# Patient Record
Sex: Female | Born: 1970 | Race: Black or African American | Hispanic: No | Marital: Married | Smoking: Never smoker
Health system: Southern US, Community
[De-identification: ages and names within clinical notes are randomized; demographics above are authoritative.]

## PROBLEM LIST (undated history)

## (undated) DIAGNOSIS — J302 Other seasonal allergic rhinitis: Secondary | ICD-10-CM

## (undated) DIAGNOSIS — E039 Hypothyroidism, unspecified: Secondary | ICD-10-CM

## (undated) DIAGNOSIS — L309 Dermatitis, unspecified: Secondary | ICD-10-CM

## (undated) DIAGNOSIS — F419 Anxiety disorder, unspecified: Secondary | ICD-10-CM

## (undated) DIAGNOSIS — I10 Essential (primary) hypertension: Secondary | ICD-10-CM

## (undated) DIAGNOSIS — K219 Gastro-esophageal reflux disease without esophagitis: Secondary | ICD-10-CM

## (undated) DIAGNOSIS — T783XXA Angioneurotic edema, initial encounter: Secondary | ICD-10-CM

## (undated) HISTORY — PX: MYOMECTOMY: SHX85

## (undated) HISTORY — DX: Angioneurotic edema, initial encounter: T78.3XXA

## (undated) HISTORY — DX: Dermatitis, unspecified: L30.9

---

## 1999-08-31 ENCOUNTER — Inpatient Hospital Stay (HOSPITAL_COMMUNITY): Admission: RE | Admit: 1999-08-31 | Discharge: 1999-09-01 | Payer: Self-pay | Admitting: Obstetrics and Gynecology

## 1999-08-31 ENCOUNTER — Encounter (INDEPENDENT_AMBULATORY_CARE_PROVIDER_SITE_OTHER): Payer: Self-pay

## 2000-03-24 ENCOUNTER — Ambulatory Visit (HOSPITAL_COMMUNITY): Admission: RE | Admit: 2000-03-24 | Discharge: 2000-03-24 | Payer: Self-pay | Admitting: Obstetrics and Gynecology

## 2000-03-24 ENCOUNTER — Encounter: Payer: Self-pay | Admitting: Obstetrics and Gynecology

## 2002-09-07 ENCOUNTER — Other Ambulatory Visit: Admission: RE | Admit: 2002-09-07 | Discharge: 2002-09-07 | Payer: Self-pay | Admitting: Obstetrics and Gynecology

## 2003-03-06 ENCOUNTER — Encounter (INDEPENDENT_AMBULATORY_CARE_PROVIDER_SITE_OTHER): Payer: Self-pay | Admitting: Specialist

## 2003-03-06 ENCOUNTER — Inpatient Hospital Stay (HOSPITAL_COMMUNITY): Admission: AD | Admit: 2003-03-06 | Discharge: 2003-03-09 | Payer: Self-pay | Admitting: Obstetrics and Gynecology

## 2003-03-10 ENCOUNTER — Inpatient Hospital Stay (HOSPITAL_COMMUNITY): Admission: AD | Admit: 2003-03-10 | Discharge: 2003-03-10 | Payer: Self-pay | Admitting: Obstetrics and Gynecology

## 2003-03-10 ENCOUNTER — Encounter: Admission: RE | Admit: 2003-03-10 | Discharge: 2003-04-09 | Payer: Self-pay | Admitting: Obstetrics and Gynecology

## 2003-04-02 ENCOUNTER — Other Ambulatory Visit: Admission: RE | Admit: 2003-04-02 | Discharge: 2003-04-02 | Payer: Self-pay | Admitting: Obstetrics and Gynecology

## 2004-05-05 ENCOUNTER — Other Ambulatory Visit: Admission: RE | Admit: 2004-05-05 | Discharge: 2004-05-05 | Payer: Self-pay | Admitting: Obstetrics and Gynecology

## 2005-04-13 ENCOUNTER — Other Ambulatory Visit: Admission: RE | Admit: 2005-04-13 | Discharge: 2005-04-13 | Payer: Self-pay | Admitting: Obstetrics and Gynecology

## 2005-09-19 ENCOUNTER — Inpatient Hospital Stay (HOSPITAL_COMMUNITY): Admission: AD | Admit: 2005-09-19 | Discharge: 2005-09-19 | Payer: Self-pay | Admitting: Obstetrics and Gynecology

## 2005-10-29 ENCOUNTER — Inpatient Hospital Stay (HOSPITAL_COMMUNITY): Admission: RE | Admit: 2005-10-29 | Discharge: 2005-11-01 | Payer: Self-pay | Admitting: Obstetrics and Gynecology

## 2006-07-14 ENCOUNTER — Emergency Department (HOSPITAL_COMMUNITY): Admission: EM | Admit: 2006-07-14 | Discharge: 2006-07-14 | Payer: Self-pay | Admitting: Emergency Medicine

## 2006-07-24 ENCOUNTER — Encounter: Admission: RE | Admit: 2006-07-24 | Discharge: 2006-07-24 | Payer: Self-pay | Admitting: Family Medicine

## 2008-10-01 ENCOUNTER — Encounter: Admission: RE | Admit: 2008-10-01 | Discharge: 2008-10-01 | Payer: Self-pay | Admitting: Family Medicine

## 2009-09-11 ENCOUNTER — Encounter: Admission: RE | Admit: 2009-09-11 | Discharge: 2009-09-11 | Payer: Self-pay | Admitting: Otolaryngology

## 2010-07-24 NOTE — Discharge Summary (Signed)
Tracy Wade, Tracy Wade            ACCOUNT NO.:  [id number]   MEDICAL RECORD NO.:  [id number]          PATIENT TYPE:  INP   LOCATION:  9107                          FACILITY:  [facility]   PHYSICIAN:  Michelle L. Grewal, M.D.DATE OF BIRTH:  08-Mar-1971   DATE OF ADMISSION:  10/29/2005  DATE OF DISCHARGE:  11/01/2005                                 DISCHARGE SUMMARY   ADMITTING DIAGNOSIS:  1. Intrauterine pregnancy at term.  2. Previous cesarean section, desires repeat.   DISCHARGE DIAGNOSIS:  Status post low transverse cesarean section to a  viable female infant.   PROCEDURE:  Repeat low transverse cesarean section.   REASON FOR ADMISSION:  Please see dictated H&P.   HOSPITAL COURSE:  The patient is 40 year old gravida 2, para 1 that  presented to [facility] for a scheduled cesarean section.  The patient had had a previous cesarean delivery and previous myomectomy.  In view of the previous myomectomy, a trial of labor was contraindicated and  the patient was now scheduled for a repeat cesarean delivery.  On the morning of admission, the patient was taken to the operating room  where spinal anesthesia was administered without difficulty.  A low  transverse incision was made with the delivery of a viable female infant,  weighing 5 pounds 13 ounces, Apgar's of 9 at 1 minute and 9 at 5 minutes.  The patient tolerated the procedure well and was taken to the recovery room  in stable condition.  On postoperative day #1, the patient was without complaint.  Vital signs  were stable.  She was afebrile.  Abdomen was soft with good return of bowel  function.  Abdominal dressing was noted to be clean, dry and intact.  Laboratory findings revealed hemoglobin of 11.1, platelet count 174,000, WBC  count of 9.2.  On postoperative day #2, the patient was without complaint.  Vital signs  remained stable.  She was afebrile.  Abdominal dressing had been removed  revealing incision that was  clean, dry and intact.  She was ambulating well,  tolerating a regular diet, without complaints of nausea, vomiting.  On postoperative day #3, the patient was without complaint.  Vital signs  remained stable.  She was afebrile.  Abdomen was soft.  Fundus was firm and  nontender.  Incision was clean, dry and intact.  Staples and the patient was  discharged home.   CONDITION ON DISCHARGE:  Good.   DIET:  Regular as tolerated.   ACTIVITY:  No heavy lifting, no driving x2 weeks, no vaginal entry.   FOLLOWUP:  1. She is to follow up in the office in 2 weeks for an incision check.  2. She is to call for temperature greater than 100 degrees, persistent      nausea, vomiting, heavy vaginal bleeding, and/or redness drainage from      incisional site.   DISCHARGE MEDICATIONS:  1. Percocet 5/325, #30, one p.o. every four to six hours p.r.n.  2. Motrin 600 mg every 6 hours.  3. Prenatal vitamins one p.o. daily.  4. Colace one p.o. daily p.r.n.  Tracy Wade, N.P.      Tracy Wade. Tracy Wade, M.D.  Electronically Signed    CC/MEDQ  D:  11/23/2005  T:  11/23/2005  Job:  [id number]

## 2010-07-24 NOTE — Discharge Summary (Signed)
NAME:  Tracy Wade, Tracy Wade                      ACCOUNT NO.:  [id number]   MEDICAL RECORD NO.:  [id number]                   PATIENT TYPE:  INP   LOCATION:  9125                                 FACILITY:  [facility]   PHYSICIAN:  Tracie Harrier, M.D.              DATE OF BIRTH:  05-17-1970   DATE OF ADMISSION:  03/06/2003  DATE OF DISCHARGE:  03/09/2003                                 DISCHARGE SUMMARY   ADMITTING DIAGNOSES:  1. Intrauterine pregnancy at 60 and four-sevenths weeks estimated     gestational age.  2. Vaginal bleeding.  3. Low-lying placenta.  4. Previous myomectomy.   DISCHARGE DIAGNOSES:  1. Status post low transverse cesarean section secondary to vaginal bleeding     with small placental abruption.  2. Viable female infant.   PROCEDURE:  Primary low transverse cesarean section.   REASON FOR ADMISSION:  Please see written H&P.   HOSPITAL COURSE:  The patient was a 40 year old female primigravida that was  admitted to [facility] at 35 and five-sevenths weeks  estimated gestational age with bright red vaginal bleeding.  The patient had  had a previous myomectomy and also had known low-lying placenta.  The  patient was given subcu terbutaline and p.o. hydration for tocolysis.  However, bright red bleeding continued.  Fetal heart tones were reactive.  Based on previous myomectomy and continued vaginal bleeding a decision was  made to proceed with a cesarean delivery.  The patient was then transferred  to the operating room where spinal anesthesia was administered without  difficulty.  A low transverse incision was made with the delivery of a  viable female infant weighing 4 pounds 6 ounces with Apgars of 9 at one minute  and 9 at five minutes.  Umbilical cord pH was 7.30.  The patient tolerated  the procedure well and was taken to the recovery room in stable condition.  On postoperative day #1 vital signs were stable; she was afebrile.  Abdomen  was soft  with good return of bowel function.  Fundus was firm and nontender.  Abdominal dressing was noted to be clean, dry, and intact.  Labs revealed  hemoglobin of 9.5; platelet count of 116,000.  On postoperative day #2 vital  signs were stable; she was afebrile.  Abdomen was soft and nontender.  Abdominal dressing had been removed revealing an incision that was clean,  dry, and intact.  On postoperative day #3 the patient was doing well.  She  was ambulating without assistance, tolerating a regular diet without  complaints of nausea and vomiting.  Fundus was firm and nontender.  Incision  was clean, dry, and intact.  Staples were removed and the patient was  discharged home.   CONDITION ON DISCHARGE:  Good.   DIET:  Regular as tolerated.   ACTIVITY:  No heavy lifting, no driving x2 weeks, no vaginal entry.   FOLLOW-UP:  The patient is to follow  up in the office in 1 week for an  incision check.  She is to call for temperature greater than 100 degrees,  persistent nausea and vomiting, heavy vaginal bleeding, and/or redness or  drainage from the incisional site.   DISCHARGE MEDICATIONS:  1. Tylox #30 one p.o. q.4-6h. p.r.n.  2. Motrin 600 mg q.6h.  3. Prenatal vitamins one p.o. daily.  4. Colace one p.o. daily p.r.n.     Julio Sicks, N.P.                        Tracie Harrier, M.D.    CC/MEDQ  D:  04/12/2003  T:  04/12/2003  Job:  [id number]

## 2010-07-24 NOTE — Op Note (Signed)
NAME:  Tracy Wade, Tracy Wade                      ACCOUNT NO.:  [id number]   MEDICAL RECORD NO.:  [id number]                   PATIENT TYPE:  INP   LOCATION:  NA                                   [facility]   PHYSICIAN:  Tracie Harrier, M.D.              DATE OF BIRTH:  12/04/1970   DATE OF PROCEDURE:  03/07/2003  DATE OF DISCHARGE:                                 OPERATIVE REPORT   PREOPERATIVE DIAGNOSIS:  1. Intrauterine pregnancy at 35-4/7 weeks.  2. Vaginal bleeding.  3. Low-lying placenta.  4. Previous myomectomy.   POSTOPERATIVE DIAGNOSIS:  1. Intrauterine pregnancy at 35-4/7 weeks.  2. Vaginal bleeding.  3. Low-lying placenta.  4. Previous myomectomy.  5. Small abruption noted.   OPERATION PERFORMED:  Primary low transverse cesarean section.   SURGEON:  Tracie Harrier, M.D.   ANESTHESIA:  Spinal.   ESTIMATED BLOOD LOSS:  800 mL.   COMPLICATIONS:  None.   FINDINGS:  At 6 through a low transverse uterine incision, a viable female  infant was delivered from the vertex presentation.  The baby was a female  weighing four pounds eight ounces.  Apgars were 9 and 9.  pH 7.30.  At time  of surgery, a small placental abruption was noted.  The adnexa was  visualized and noted to be normal.  The uterus was somewhat globularly  enlarged.   DESCRIPTION OF PROCEDURE:  The patient was taken to the operating room where  a spinal anesthetic was administered.  The patient was placed on the  operating table in the left lateral tilt position.  The abdomen was prepped  and draped in the usual sterile fashion with Betadine and sterile drapes.  A  Foley catheter was inserted.  The abdomen was then entered through a  Pfannenstiel incision and carried down sharply in the usual fashion.  The  peritoneum was atraumatically entered.  The vesicouterine peritoneum  overlying the lower uterine segment was incised and a bladder flap was  bluntly and sharply created over the lower uterine  segment.  A bladder blade  was then placed behind the bladder to ensure it protection during the C-  section.  Next, the uterus was entered through a low transverse incision and  carried out laterally using the operator's fingers.  The vertex was elevated  into the incision and delivered promptly and easily with the assistance of a  Mighty-Vac vacuum extraction device and delivered easily at 0804.  The  oronasopharynx was thoroughly bulb suctioned and the cord doubly clamped and  cut.  The baby handed promptly to the pediatricians.  The baby was a female  weighing four pounds eight ounces.  The baby did well.  Apgars were 9 and 9.  Cord pH 7.30.  The placenta was then extracted intact with a three-vessel  cord without difficulty.  There was a small abruption noted and the placenta  was sent to pathology.  The anterior of the uterus was wiped clean with a  wet sponge.  The uterine incision was then closed in a two layer fashion.  The first layer a running interlocking suture of #1 Vicryl.  A second  imbricating suture was placed across the primary suture line with a running  suture of #1 Vicryl as well.  Good hemostasis was noted.  The pelvis was  thoroughly irrigated.  Good hemostasis was noted again and attention was  turned to closure.  The rectus muscle and anterior peritoneum was closed  with a running suture of #1 Vicryl.  The subfascial layers were hemostatic.  The fascia was closed with a running suture of #1 Vicryl.  The subcutaneous  tissue was irrigated and made hemostatic using the Bovie cautery.  The skin  was reapproximated with staples and a sterile dressing applied.  Final  sponge, needle and instrument counts were correct times three.  There were  no perioperative complications.  The baby initially did well.  The patient  did receive antibiotic intraoperatively.                                               Tracie Harrier, M.D.    REG/MEDQ  D:  03/07/2003  T:   03/07/2003  Job:  [id number]

## 2010-07-24 NOTE — Discharge Summary (Signed)
[facility]  Patient:    Tracy Wade, Tracy Wade                   MRN: [id number] Adm. Date:  [id number] Disc. Date: [id number] Attending:  Lendon Colonel                           Discharge Summary  ADMISSION DIAGNOSIS:  Uterine leiomyoma.  DISCHARGE DIAGNOSIS:  Uterine leiomyoma.  OPERATION:  Myomectomy.  BRIEF HISTORY:  This is a 40 -year-old female who was advised to have a myomectomy for a large uterine fibroid.  Detailed informed consent was given to the patient prior to surgery.  Admission hemoglobin was 13, coagulation profile and metabolic profile were normal.  HOSPITAL COURSE:  The patient underwent an uneventful myomectomy with removal of a 142-g uterine fibroid.  Her postoperative course was uncomplicated.  She remained afebrile and without complaints.  She had one focal area of endometriosis in a tube which was treated with cauterization.  She was discharged on the morning after surgery to home and office care.  Asked to call for fever or bleeding or any other difficulties she would encounter.  CONDITION ON DISCHARGE:  Improved. DD:  09/08/99 TD:  09/08/99 Job: [id number]

## 2010-07-24 NOTE — Op Note (Signed)
Tracy Wade, Tracy Wade            ACCOUNT NO.:  [id number]   MEDICAL RECORD NO.:  [id number]          PATIENT TYPE:  INP   LOCATION:  9107                          FACILITY[facility]   PHYSICIAN:  Juluis Mire, M.D.   DATE OF BIRTH:  12/25/1970   DATE OF PROCEDURE:  10/29/2005  DATE OF DISCHARGE:                                 OPERATIVE REPORT   PREOPERATIVE DIAGNOSES:  1. Intrauterine pregnancy at term.  2. Prior cesarean section, desires a repeat.   POSTOPERATIVE DIAGNOSES:  1. Intrauterine pregnancy at term.  2. Prior cesarean section, desires a repeat.   OPERATION:  Repeat low transverse cesarean section./   ANESTHESIA:  Spinal anesthesia.   SURGEON:  Juluis Mire, M.D.   ESTIMATED BLOOD LOSS:  Was 400 to 500 mL.   INTRAOPERATIVE BLOOD REPLACED:  None.   COMPLICATIONS:  None.   INDICATIONS FOR PROCEDURE:  As dictated in the history and physical.   DESCRIPTION OF PROCEDURE:  The patient was taken to the operating room and  placed in the supine position with a left lateral tilt.  After a  satisfactory level of spinal anesthesia was obtained, the abdomen was  prepped out with Betadine and draped as a sterile field.  The prior low  transverse skin incision was identified and excised.  The incision was then  extended through the subcutaneous tissue.  The fascia was then entered  sharply and the incision in the fascia was extended laterally.  The fascia  was taken off the muscle superiorly and inferiorly.  The rectus muscles were  separated in the midline.  The peritoneum was entered sharply and the  incision in the peritoneum was extended both superiorly and inferiorly.  A  low transverse bladder flap was developed.  A low transverse incision was  begun with a knife.  Amniotic fluid was clear.  The infant was delivered  with the aid of a vacuum extractor and fundal pressure.  It was a viable  female weighing 5 pounds and 14 ounces.  Apgars were 9/9.  The pH is pending.  The placenta was delivered manually and sent for pathological review.  The  uterus was closed with interlocking sutures of #0 chromic using a two-layer  closure technique.  We had good hemostasis and clear urine output.  The  tubes and ovaries were unremarkable.  The muscle was reapproximated with  running suture of #3-0 Vicryl.  The fascia was closed with running suture of  #0 PDS.  The skin was closed with staples and Steri-Strips.  The sponge,  instrument  and needle counts were reported as correct by the circulating nurse x2.  The  Foley catheter remained clear at the time of closure.   The patient tolerated the procedure well and was returned to the recovery  room in good condition.      Juluis Mire, M.D.  Electronically Signed     JSM/MEDQ  D:  10/29/2005  T:  10/29/2005  Job:  [id number]

## 2010-07-24 NOTE — Op Note (Signed)
[facility]  Patient:    Tracy Wade, Tracy Wade                   MRN: [id number] Proc. Date: 08/31/99 Adm. Date:  [id number] Attending:  Lendon Colonel                           Operative Report  PREOPERATIVE DIAGNOSIS:  Uterine myoma, uterine fibroids.  POSTOPERATIVE DIAGNOSIS:  Uterine myoma, uterine fibroids.  POSTOPERATIVE DIAGNOSIS:  Myomectomy, fulguration of focal endometriosis of left fallopian tube and small seedling myoma anteriorly.  DESCRIPTION OF PROCEDURE:  The patient was placed in lithotomy position, prepped and draped in the usual fashion. The abdomen was entered through a transverse approach. Hemostasis with the Bovie. The peritoneum was opened vertically. The fibroid was a fundal fibroid off the posterior surface. It was grasped, infiltrated with Pitressin solution and the fibroid was excised. Hemostasis was accomplished with 3-0 Vicryl and the myoma bed was closed with interrupted sutures of 3-0 Vicryl. Hemostasis was accomplished with the Bovied. Meticulous hemostasis was maintained. There was a small bluish area just under the left fallopian tube that was cauterized with the Bovie and a small seedling myoma that was less than 0.1 cm. The tubes and ovaries all looked perfectly normal. The peritoneum was copiously washed, parietoperitoneum was closed with 2-0 PDS, the fascia was closed with 2-0 PDS continuous and interrupted. The skin was closed with clips after hemostasis was secure. The incision was then infiltrated with 0.5% Marcaine with epinephrine. Ms. Lynden Ang tolerated this procedure well and was sent to the recovery room in good condition. DD:  08/31/99 TD:  08/31/99 Job: [id number]

## 2010-07-24 NOTE — H&P (Signed)
NAME:  Tracy Wade, Tracy Wade NO.:  [id number]   MEDICAL RECORD NO.:  [id number]          PATIENT TYPE:  INP   LOCATION:                                FACILITY[facility]  [facility]   PHYSICIAN:  Juluis Mire, M.D.        DATE OF BIRTH:   DATE OF ADMISSION:  10/29/2005  DATE OF DISCHARGE:                                HISTORY & PHYSICAL   HISTORY OF PRESENT ILLNESS:  The patient is a 40 year old gravida 2, para 1,  abortus 0 female who presents for repeat cesarean section.  Estimated date  of confinement is September 4, this gives her an estimated gestational age  of [redacted] weeks, consistent with initial exam and ultrasound.   The patient had a prior myomectomy.  Last pregnancy we did a primary  cesarean section because of this.  In view of the myomectomy, trial of labor  was contraindicated, and we will proceed with another cesarean section.   The patient is also at risk for advanced maternal age.  She declined  amniocentesis.  She did undergo first trimester screening at the [facility] that was negative.  Her prenatal course was otherwise  uncomplicated.   ALLERGIES:  SULFA AND ERYTHROMYCIN.   MEDICATIONS:  Prenatal vitamins.   PAST MEDICAL/FAMILY/SOCIAL HISTORY:  Please see prenatal records.   REVIEW OF SYSTEMS:  Noncontributory.   PHYSICAL EXAMINATION:  VITAL SIGNS:  The patient is afebrile with stable  vital signs.  HEENT:  The patient is normocephalic.  Pupils equal, round and reactive to  light and accommodations.  Extraocular movements intact.  Sclerae and  conjunctivae were clear.  Oropharynx clear.  NECK:  Without thyromegaly.  BREASTS:  No discrete masses.  LUNGS:  Clear.  CARDIOVASCULAR:  Regular rate, grade 2/6 systolic ejection murmur.  No  clicks or gallops.  ABDOMEN:  Gravid uterus, consistent with dates.  Well healed low transverse  incision.  PELVIC:  Deferred.  EXTREMITIES:  Trace edema.  NEUROLOGICAL:  Grossly within normal limits.  Deep  tendon reflexes were 2+  with no clonus.   IMPRESSION:  Intrauterine pregnancy at term with prior cesarean section.  Desires to repeat.   PLAN:  The patient will undergo repeat cesarean section.  Risks have been  discussed including the risk of infection.  Risks of hemorrhage that could  require transfusion, risk of AIDS or hepatitis, risk or injury to adjacent  organs including bladder/bowel/ureter that could require further exploratory  surgery, risk of deep vein thrombosis and pulmonary embolus.  The patient  expressed understanding of indications and risks.      Juluis Mire, M.D.  Electronically Signed     JSM/MEDQ  D:  10/29/2005  T:  10/29/2005  Job:  [id number]

## 2011-02-02 ENCOUNTER — Other Ambulatory Visit: Payer: Self-pay | Admitting: Family Medicine

## 2011-02-02 DIAGNOSIS — R209 Unspecified disturbances of skin sensation: Secondary | ICD-10-CM

## 2011-02-04 ENCOUNTER — Ambulatory Visit
Admission: RE | Admit: 2011-02-04 | Discharge: 2011-02-04 | Disposition: A | Discharge status: Home / Self Care | Payer: BC Managed Care – PPO | Source: Ambulatory Visit | Attending: Family Medicine | Admitting: Family Medicine

## 2011-02-04 DIAGNOSIS — R209 Unspecified disturbances of skin sensation: Secondary | ICD-10-CM

## 2011-02-04 MED ORDER — GADOBENATE DIMEGLUMINE 529 MG/ML IV SOLN
13.0000 mL | Freq: Once | INTRAVENOUS | Status: AC | PRN
  Administered 2011-02-04: 13 mL via INTRAVENOUS

## 2011-05-30 ENCOUNTER — Encounter (HOSPITAL_COMMUNITY): Payer: Self-pay

## 2011-05-30 ENCOUNTER — Other Ambulatory Visit: Payer: Self-pay

## 2011-05-30 ENCOUNTER — Emergency Department (HOSPITAL_COMMUNITY): Payer: BC Managed Care – PPO

## 2011-05-30 ENCOUNTER — Observation Stay (HOSPITAL_COMMUNITY)
Admission: EM | Admit: 2011-05-30 | Discharge: 2011-05-31 | Disposition: A | Discharge status: Home / Self Care | Payer: BC Managed Care – PPO | Attending: Family Medicine | Admitting: Family Medicine

## 2011-05-30 DIAGNOSIS — IMO0001 Reserved for inherently not codable concepts without codable children: Secondary | ICD-10-CM | POA: Insufficient documentation

## 2011-05-30 DIAGNOSIS — F419 Anxiety disorder, unspecified: Secondary | ICD-10-CM

## 2011-05-30 DIAGNOSIS — R079 Chest pain, unspecified: Secondary | ICD-10-CM | POA: Insufficient documentation

## 2011-05-30 DIAGNOSIS — I10 Essential (primary) hypertension: Secondary | ICD-10-CM | POA: Insufficient documentation

## 2011-05-30 DIAGNOSIS — Z566 Other physical and mental strain related to work: Secondary | ICD-10-CM

## 2011-05-30 DIAGNOSIS — R9431 Abnormal electrocardiogram [ECG] [EKG]: Secondary | ICD-10-CM | POA: Insufficient documentation

## 2011-05-30 DIAGNOSIS — E876 Hypokalemia: Secondary | ICD-10-CM | POA: Insufficient documentation

## 2011-05-30 DIAGNOSIS — R002 Palpitations: Principal | ICD-10-CM | POA: Insufficient documentation

## 2011-05-30 DIAGNOSIS — R072 Precordial pain: Secondary | ICD-10-CM

## 2011-05-30 DIAGNOSIS — F459 Somatoform disorder, unspecified: Secondary | ICD-10-CM | POA: Insufficient documentation

## 2011-05-30 DIAGNOSIS — F411 Generalized anxiety disorder: Secondary | ICD-10-CM | POA: Insufficient documentation

## 2011-05-30 LAB — POCT I-STAT, CHEM 8
Calcium, Ion: 1.2 mmol/L (ref 1.12–1.32)
Glucose, Bld: 99 mg/dL (ref 70–99)
HCT: 47 % — ABNORMAL HIGH (ref 36.0–46.0)
Hemoglobin: 16 g/dL — ABNORMAL HIGH (ref 12.0–15.0)
TCO2: 23 mmol/L (ref 0–100)

## 2011-05-30 LAB — D-DIMER, QUANTITATIVE: D-Dimer, Quant: 0.59 ug/mL-FEU — ABNORMAL HIGH (ref 0.00–0.48)

## 2011-05-30 LAB — DIFFERENTIAL
Basophils Absolute: 0 10*3/uL (ref 0.0–0.1)
Basophils Relative: 0 % (ref 0–1)
Neutro Abs: 4.7 10*3/uL (ref 1.7–7.7)
Neutrophils Relative %: 65 % (ref 43–77)

## 2011-05-30 LAB — CBC
MCHC: 35.9 g/dL (ref 30.0–36.0)
RDW: 12.2 % (ref 11.5–15.5)
WBC: 7.2 10*3/uL (ref 4.0–10.5)

## 2011-05-30 LAB — CARDIAC PANEL(CRET KIN+CKTOT+MB+TROPI)
Relative Index: 1.5 (ref 0.0–2.5)
Relative Index: 1.4 (ref 0.0–2.5)
Troponin I: <0.3 ng/mL (ref <0.30)

## 2011-05-30 LAB — MAGNESIUM: Magnesium: 1.6 mg/dL (ref 1.5–2.5)

## 2011-05-30 LAB — POCT I-STAT TROPONIN I: Troponin i, poc: 0 ng/mL (ref 0.00–0.08)

## 2011-05-30 MED ORDER — LORAZEPAM 0.5 MG PO TABS
0.5000 mg | ORAL_TABLET | Freq: Every day | ORAL | Status: DC
  Administered 2011-05-30: 0.5 mg via ORAL
  Filled 2011-05-30: qty 1

## 2011-05-30 MED ORDER — FLUTICASONE PROPIONATE 50 MCG/ACT NA SUSP
2.0000 | Freq: Every day | NASAL | Status: DC
  Administered 2011-05-31: 2 via NASAL
  Filled 2011-05-30: qty 16

## 2011-05-30 MED ORDER — ALUM & MAG HYDROXIDE-SIMETH 200-200-20 MG/5ML PO SUSP: 30.0000 mL | Freq: Four times a day (QID) | ORAL | Status: DC | PRN

## 2011-05-30 MED ORDER — ASPIRIN 81 MG PO CHEW
324.0000 mg | CHEWABLE_TABLET | Freq: Once | ORAL | Status: AC
  Administered 2011-05-30: 324 mg via ORAL
  Filled 2011-05-30: qty 4

## 2011-05-30 MED ORDER — CALCIUM CARBONATE ANTACID 500 MG PO CHEW
1.0000 | CHEWABLE_TABLET | Freq: Three times a day (TID) | ORAL | Status: DC
  Administered 2011-05-30 – 2011-05-31 (×3): 200 mg via ORAL
  Filled 2011-05-30 (×7): qty 1

## 2011-05-30 MED ORDER — SODIUM CHLORIDE 0.9 % IV BOLUS (SEPSIS)
500.0000 mL | Freq: Once | INTRAVENOUS | Status: AC
  Administered 2011-05-30: 500 mL via INTRAVENOUS

## 2011-05-30 MED ORDER — ADULT MULTIVITAMIN W/MINERALS CH
1.0000 | ORAL_TABLET | Freq: Every day | ORAL | Status: DC
  Administered 2011-05-30 – 2011-05-31 (×2): 1 via ORAL
  Filled 2011-05-30 (×2): qty 1

## 2011-05-30 MED ORDER — IOHEXOL 350 MG/ML SOLN
80.0000 mL | Freq: Once | INTRAVENOUS | Status: AC | PRN
  Administered 2011-05-30: 80 mL via INTRAVENOUS

## 2011-05-30 MED ORDER — LORAZEPAM 1 MG PO TABS
1.0000 mg | ORAL_TABLET | ORAL | Status: AC
  Administered 2011-05-30: 1 mg via ORAL
  Filled 2011-05-30: qty 1

## 2011-05-30 MED ORDER — ASPIRIN EC 81 MG PO TBEC
81.0000 mg | DELAYED_RELEASE_TABLET | Freq: Every day | ORAL | Status: DC
  Administered 2011-05-31: 81 mg via ORAL
  Filled 2011-05-30 (×2): qty 1

## 2011-05-30 MED ORDER — METOPROLOL TARTRATE 1 MG/ML IV SOLN
2.5000 mg | Freq: Once | INTRAVENOUS | Status: AC
  Administered 2011-05-30: 2.5 mg via INTRAVENOUS
  Filled 2011-05-30: qty 5

## 2011-05-30 MED ORDER — POTASSIUM CHLORIDE CRYS ER 20 MEQ PO TBCR
40.0000 meq | EXTENDED_RELEASE_TABLET | Freq: Once | ORAL | Status: AC
  Administered 2011-05-30: 40 meq via ORAL
  Filled 2011-05-30: qty 2

## 2011-05-30 MED ORDER — HYDROCODONE-ACETAMINOPHEN 5-325 MG PO TABS: 1.0000 | ORAL_TABLET | ORAL | Status: DC | PRN

## 2011-05-30 MED ORDER — ASPIRIN 81 MG PO TABS: 324.0000 mg | ORAL_TABLET | Freq: Once | ORAL | Status: DC

## 2011-05-30 NOTE — Progress Notes (Signed)
CSW assessment completed. Dori Shey Yott MSW,LCSW w/e Coverage [phone]  

## 2011-05-30 NOTE — ED Notes (Signed)
Attempted to call report. RN from 2000 will call back

## 2011-05-30 NOTE — ED Provider Notes (Signed)
History     CSN: [id number]  Arrival date & time 05/30/11  4098   First MD Initiated Contact with Patient 05/30/11 [phone]      Chief Complaint  Patient presents with  . Palpitations    (Consider location/radiation/quality/duration/timing/severity/associated sxs/prior treatment) Patient is a 41 y.o. female presenting with palpitations and chest pain. The history is provided by the patient. No language interpreter was used.  Palpitations  This is a new problem. The current episode started more than 1 week ago. Episode frequency: occasionally. The problem has been gradually worsening. Associated with: nothing, awoke her from sleep tonight. Associated symptoms include chest pain. Pertinent negatives include no diaphoresis and no shortness of breath. Risk factors include family history. Her past medical history does not include heart disease or hyperthyroidism.  Chest Pain The chest pain began less than 1 hour ago. Duration of episode(s) is 15 minutes. Chest pain occurs intermittently. The chest pain is unchanged. Associated with: none. At its most intense, the pain is at 5/10. The pain is currently at 1/10. The severity of the pain is moderate. The quality of the pain is described as pressure-like. The pain does not radiate (left mid chest). Exacerbated by: nothing. Primary symptoms include palpitations. Pertinent negatives for primary symptoms include no shortness of breath.  The palpitations did not occur with shortness of breath.  Pertinent negatives for associated symptoms include no diaphoresis. She tried nothing for the symptoms. Risk factors include stress.  Pertinent negatives for past medical history include no CAD and no heart disease.  Her family medical history is significant for CAD in family.  Procedure history is negative for cardiac catheterization.     History reviewed. No pertinent past medical history.  Past Surgical History  Procedure Date  . Cesarean section     No  family history on file.  History  Substance Use Topics  . Smoking status: Never Smoker   . Smokeless tobacco: Not on file  . Alcohol Use: No    OB History    Grav Para Term Preterm Abortions TAB SAB Ect Mult Living                  Review of Systems  Constitutional: Negative.  Negative for diaphoresis.  HENT: Negative.   Eyes: Negative.   Respiratory: Negative for shortness of breath.   Cardiovascular: Positive for chest pain and palpitations. Negative for leg swelling.  Gastrointestinal: Negative.   Genitourinary: Negative.   Musculoskeletal: Negative.   Skin: Negative.   Neurological: Negative.   Hematological: Negative.   Psychiatric/Behavioral: Negative.     Allergies  Erythromycin and Sulfa antibiotics  Home Medications   Current Outpatient Rx  Name Route Sig Dispense Refill  . CALCIUM CARBONATE ANTACID 500 MG PO CHEW Oral Chew 1 tablet by mouth 3 (three) times daily. Bloating/ burning sensation    . FLUTICASONE PROPIONATE 50 MCG/ACT NA SUSP Nasal Place 2 sprays into the nose daily.    . ADULT MULTIVITAMIN W/MINERALS CH Oral Take 1 tablet by mouth daily.    Marland Kitchen RANITIDINE HCL 75 MG PO TABS Oral Take 75 mg by mouth once.    Marland Kitchen SALINE NASAL SPRAY 0.65 % NA SOLN Nasal Place 1 spray into the nose as needed.      BP 156/96  Pulse 104  Temp(Src) 97.9 F (36.6 C) (Oral)  Resp 18  Ht 5\' 3"  (1.6 m)  Wt 140 lb (63.504 kg)  BMI 24.80 kg/m2  SpO2 98%  LMP 05/09/2011  Physical  Exam  Constitutional: She is oriented to person, place, and time. She appears well-developed and well-nourished.  HENT:  Head: Normocephalic and atraumatic.  Mouth/Throat: Oropharynx is clear and moist.  Eyes: Conjunctivae are normal. Pupils are equal, round, and reactive to light.  Neck: Normal range of motion. Neck supple. No tracheal deviation present.  Cardiovascular: Regular rhythm.  Tachycardia present.   Pulmonary/Chest: Effort normal and breath sounds normal. She has no wheezes. She  has no rales.  Abdominal: Soft. Bowel sounds are normal. There is no tenderness. There is no rebound and no guarding.  Musculoskeletal: Normal range of motion.  Lymphadenopathy:    She has no cervical adenopathy.  Neurological: She is alert and oriented to person, place, and time.  Skin: Skin is warm and dry. She is not diaphoretic.  Psychiatric: Judgment and thought content normal.    ED Course  Procedures (including critical care time)  Labs Reviewed  CBC - Abnormal; Notable for the following:    Hemoglobin 15.9 (*)    All other components within normal limits  D-DIMER, QUANTITATIVE - Abnormal; Notable for the following:    D-Dimer, Quant 0.59 (*)    All other components within normal limits  POCT I-STAT, CHEM 8 - Abnormal; Notable for the following:    Potassium 3.0 (*)    Hemoglobin 16.0 (*)    HCT 47.0 (*)    All other components within normal limits  DIFFERENTIAL  POCT I-STAT TROPONIN I  POCT PREGNANCY, URINE   Dg Chest 2 View  05/30/2011  *RADIOLOGY REPORT*  Clinical Data: Palpitations, tachycardia  CHEST - 2 VIEW  Comparison: None  Findings: Normal heart size, mediastinal contours, and pulmonary vascularity. Lungs clear. No pleural effusion or pneumothorax. No acute osseous findings.  IMPRESSION: No acute abnormalities.  Original Report Authenticated By: Lollie Marrow, M.D.     No diagnosis found.    MDM   Date: 05/30/2011  Rate: 122  Rhythm: sinus tachycardia  QRS Axis: normal  Intervals: normal  ST/T Wave abnormalities: nonspecific ST changes  Conduction Disutrbances:none  Narrative Interpretation:   Old EKG Reviewed: none available   Case d/w cardiology fellow Dr. Tresa Endo to be seen by cardiology, admit to hospitalist ASA and potassium given.  Will begin IV lopressor and will recheck EKG    Issaac Shipper K Shaniquia Brafford-Rasch, MD 05/30/11 [phone]

## 2011-05-30 NOTE — ED Notes (Signed)
Pt ambulated to bathroom, steady gait, no c/o sob

## 2011-05-30 NOTE — H&P (Signed)
PCP:   No primary provider on file.   Chief Complaint:  Heart rate was high   Patient is a pleasant 41 yr old female no medical illnesses who presents with a 2 day history of Palpitations.  States her HR was elevated on Thursday and has been taking blood pressure meds and notes that she has some blood pressure concerns.  Currently not on blood pressure meedciines and recenryl went to gynecologist and a check there showed blood pressure in the 130/100.  A week prior to that, had some lower back pain.  Blood pressure then was 117/77.    Has recently started running trying to get fit and is usually at 140 pounds.   Yesterday morning awaoke with a HR of 110.  Has significant stress and anxiety and states that for the past week, has had work and numerous other stresses, has 2 small children at home.  Feels rushed and has felt rushed over the past couple weeks.  Stress level per her significatn other has been higher in the past 3 weeks .  Sleeps very late most nights and awakens very early fo4r the past week.  Early morning awakening as well usually at 3:00 am for the past week. On running without her sports bra, she noted she also started having some increasing chest dicomfort.  It was this week.  Can also recall running one week before and ate prior to this and towards the end of the run, felt this sensation again with a  Direct food to running relationship.    Her Hr dropped to the 80's in the ED without any of the MD's coming in and then it dropped .  [place] 2 nights ago, and had a Panic attack" and started to cry, and couldn't really understand why  In the Emergency room, and EKG was done showing ST segment depressions in contiguous V4-V6, as well as similar depressions in Lead 2 and 3, with a baseline sinus tachycardia, that totally resolved on administration of Ativan and Metoprolol IV.  She received ASA 325, had a CT chest negative for PE, Her Potassium was found slighlty low at 3.0, with the rest  of her labs being completely normal   Review of Systems:  Does admit to having a presure in the the middle of her L breast on a scale of 4/10.  And taking a deep breath does occasionally help, deep indigestion like pain and a slight burning. ONce again admits to significant stress.   No weakness on any one side of the body, no blurred, double vision, Mild Headaches +(took Ibuprofen), no Gi bleed,fever, cough,vomi\t  Past Medical History: History reviewed. No pertinent past medical history.  Past surgical history: Past Surgical History  Procedure Date  . Cesarean section     Medications: Prior to Admission medications   Medication Sig Start Date End Date Taking? Authorizing Provider  calcium carbonate (TUMS - DOSED IN MG ELEMENTAL CALCIUM) 500 MG chewable tablet Chew 1 tablet by mouth 3 (three) times daily. Bloating/ burning sensation   Yes Historical Provider, MD  fluticasone (FLONASE) 50 MCG/ACT nasal spray Place 2 sprays into the nose daily.   Yes Historical Provider, MD  Multiple Vitamin (MULITIVITAMIN WITH MINERALS) TABS Take 1 tablet by mouth daily.   Yes Historical Provider, MD  ranitidine (ZANTAC) 75 MG tablet Take 75 mg by mouth once.   Yes Historical Provider, MD  sodium chloride (OCEAN) 0.65 % nasal spray Place 1 spray into the nose as needed.  Yes Historical Provider, MD    Allergies:   Allergies  Allergen Reactions  . Erythromycin Nausea And Vomiting  . Sulfa Antibiotics Rash    Social History:  reports that she has never smoked. She does not have any smokeless tobacco history on file. She reports that she does not drink alcohol or use illicit drugs.  Family History: No family history on file.  Physical Exam: Filed Vitals:   05/30/11 0343 05/30/11 0515  BP: 160/87 156/96  Pulse: 131 104  Temp: 97.9 F (36.6 C)   TempSrc: Oral   Resp: 19 18  Height: 5\' 3"  (1.6 m)   Weight: 63.504 kg (140 lb)   SpO2: 100% 98%    HEENT-alert pleasant female, but becomes  anxious i the room, and has to take deep breaths CHEST-cta b, no tvr/tvf CARDS-s1 s2 tachycardic.  RRR.  No M/r/g ABD-soft, nt'nd SKIN-grossly intact NEURO-alert, oriented x3 speech: normal in context and clarity memory: intact grossly cranial nerves II-XII: intact motor strength: full proximally and distally no involuntary movements or tremors sensation: intact to vibration, pain, and light touch cerebellar: finger-to-nose and heel-to-shin intact gait: normal reflexes: full and symmetric plantar responses: downgoing bilaterally   Labs on Admission:   [facility] 05/30/11 0538  NA 142  K 3.0*  CL 105  CO2 --  GLUCOSE 99  BUN 11  CREATININE 0.80  CALCIUM --  MG --  PHOS --   No results found for this basename: AST:2,ALT:2,ALKPHOS:2,BILITOT:2,PROT:2,ALBUMIN:2 in the last 72 hours No results found for this basename: LIPASE:2,AMYLASE:2 in the last 72 hours  Basename 05/30/11 0538 05/30/11 0459  WBC -- 7.2  NEUTROABS -- 4.7  HGB 16.0* 15.9*  HCT 47.0* 44.3  MCV -- 88.4  PLT -- 230   No results found for this basename: CKTOTAL:3,CKMB:3,CKMBINDEX:3,TROPONINI:3 in the last 72 hours No results found for this basename: TSH,T4TOTAL,FREET3,T3FREE,THYROIDAB in the last 72 hours No results found for this basename: VITAMINB12:2,FOLATE:2,FERRITIN:2,TIBC:2,IRON:2,RETICCTPCT:2 in the last 72 hours  Radiological Exams on Admission: Dg Chest 2 View  05/30/2011  *RADIOLOGY REPORT*  Clinical Data: Palpitations, tachycardia  CHEST - 2 VIEW  Comparison: None  Findings: Normal heart size, mediastinal contours, and pulmonary vascularity. Lungs clear. No pleural effusion or pneumothorax. No acute osseous findings.  IMPRESSION: No acute abnormalities.  Original Report Authenticated By: Lollie Marrow, M.D.   Ct Angio Chest W/cm &/or Wo Cm  05/30/2011  *RADIOLOGY REPORT*  Clinical Data: Palpitations, anterior chest discomfort, tachycardia, shivers  CT ANGIOGRAPHY CHEST  Technique:  Multidetector  CT imaging of the chest using the standard protocol during bolus administration of intravenous contrast. Multiplanar reconstructed images including MIPs were obtained and reviewed to evaluate the vascular anatomy.  Contrast: 80mL OMNIPAQUE IOHEXOL 350 MG/ML IV SOLN  Comparison: None  Findings: Aorta normal caliber without aneurysm or dissection. Visualized portion of upper abdomen normal appearance. No thoracic adenopathy. Pulmonary arteries patent. No evidence pulmonary embolism. Lungs clear. No pleural effusion or pneumothorax. Nonspecific focal area of subpleural density identified paraspinal in the medial right upper hemithorax, approximately 15 x 5 mm, fluid attenuation, of doubtful significance. No acute osseous findings.  IMPRESSION: No evidence of pulmonary embolism.  Original Report Authenticated By: Lollie Marrow, M.D.    Assessment/Plan Patient Active Problem List  Diagnoses  . Nonspecific abnormal electrocardiogram (ECG)-patient came in with classic signs of likely stress disorder/panic disorder causing somatization however initial EKG does show J-point depression 1 mm in V5 V4 V6 as well as lead 2 and V3. This resolved  with administration of metoprolol to slow down the heart rate and I think these are repolarization abnormalities. Her point-of-care markers are negative however patient would feel more comfortable ruling out acute coronary syndrome as an inpatient-have already explained to her I think that with a negative CT of the chest, and with a multitude of stressors she's been under, the use of this is likely low.  I doubt that there is a specific need for further workup at this stage in terms of having a cardiac catheterization/echocardiogram and a relatively healthy female who can jog and is now currently more active than she used to be. Her TIMI score is 8% if you count her chest pains as angina, which I am not convinced of.  I would stratify her and get a lipid panel as well in view of  family history of premature MI and likely she can follow up with her primary care physician if all of her labs turn out to be normal in terms of her cardiac markers.   . Anxiety with somatization-patient will be given Ativan on every 8 hour when necessary schedule and I'll update her of the results of her cardiac enzymes to ensure she is a breast situation does not worry more about this   .  Stress at work-see above    hypokalemia-replaced by EDP  . White coat hypertension-see above    Obs patient to Team 9 Tele  Janayla Marik,JAI 05/30/2011, 7:11 AM

## 2011-05-30 NOTE — Progress Notes (Signed)
*  PRELIMINARY RESULTS* Echocardiogram 2D Echocardiogram has been performed.  [facility] 05/30/2011, 3:23 PM

## 2011-05-30 NOTE — ED Notes (Signed)
Pt c/o heart racing. Pt thought she was having indigestion

## 2011-05-30 NOTE — Consult Note (Signed)
CARDIOLOGY CONSULT NOTE  Patient ID: Tracy Wade, MRN: [id number], DOB/AGE: 42-May-1972 42 y.o. Admit date: 05/30/2011 Date of Consult: 05/30/2011  Primary Physician: No primary provider on file. Primary Cardiologist:  New (Dr. Cassell Clement)  Reason for Consultation: palpitations and chest pain  History of Present Illness: Tracy Wade is a 41 y.o. female who presented to [facility] ED last night with the above complaints.  She has no h/o CAD, HTN, DM2, thyroid disease.  She has been under increased stress recently.  She started running [place] [employer] 1-2 weeks ago.  No exertional chest pain or dyspnea.  She has noted some palpitations over the last week.  She has started to check her BP with her mother's machine and HR's have been in the 110 range at times.  She had a BP 130/100 at her Gyn's office recently.  BP usually is in the 110 range.  Last night she started having "indigestion."  She took OTC Zantac with some relief.  She noted more rapid palps and tried to go to sleep.  She had to get up to urinate several times and noted more rapid palpitations.  Also noted left chest pressure.  No assoc radiation, nausea, diaphoresis, dyspnea.  This was concerning and she came to the ED.  CXR was ok.  Chest CT was neg for PE.  She was tachycardic when she came in (HR 122) and she was given Metoprolol 2.5 mg IV x 1.  Cardiac markers negative so far.  K+ noted to be low at 3.0 and this has been supplemented.   Past Medical History: None   Past Surgical History  Procedure Date  . Cesarean section     and numerous other gynecological procedures  . Myomectomy     fibroid tumor     Home Meds: Prior to Admission medications   Medication Sig Start Date End Date Taking? Authorizing Provider  calcium carbonate (TUMS - DOSED IN MG ELEMENTAL CALCIUM) 500 MG chewable tablet Chew 1 tablet by mouth 3 (three) times daily. Bloating/ burning sensation   Yes Historical Provider, MD  fluticasone  (FLONASE) 50 MCG/ACT nasal spray Place 2 sprays into the nose daily.   Yes Historical Provider, MD  Multiple Vitamin (MULITIVITAMIN WITH MINERALS) TABS Take 1 tablet by mouth daily.   Yes Historical Provider, MD  ranitidine (ZANTAC) 75 MG tablet Take 75 mg by mouth once.   Yes Historical Provider, MD  sodium chloride (OCEAN) 0.65 % nasal spray Place 1 spray into the nose as needed.   Yes Historical Provider, MD    Inpatient Medications:    . aspirin  324 mg Oral Once  . LORazepam  1 mg Oral STAT  . metoprolol  2.5 mg Intravenous Once  . potassium chloride SA  40 mEq Oral Once  . sodium chloride  500 mL Intravenous Once  . DISCONTD: aspirin  324 mg Oral Once    Allergies: Allergies  Allergen Reactions  . Erythromycin Nausea And Vomiting  . Sulfa Antibiotics Rash    History  Substance Use Topics  . Smoking status: Never Smoker   . Smokeless tobacco: Never Used  . Alcohol Use: Yes     rare glass of wine     Family History  Problem Relation Age of Onset  . Heart failure Father   . Heart attack Father 89  . Stroke Father      ROS:  Please see the history of present illness.   Increased stress recently (work,  home, etc).  Not sleeping well.  No diarrhea, constipation, weight changes, skin changes, hair changes.   All other systems reviewed and negative.    Vital Signs: Blood pressure 156/96, pulse 104, temperature 97.9 F (36.6 C), temperature source Oral, resp. rate 18, height 5\' 3"  (1.6 m), weight 140 lb (63.504 kg), last menstrual period 05/09/2011, SpO2 98.00%.   PHYSICAL EXAM: General:  Well nourished, well developed, in no acute distress HEENT: normal Neck: no JVD Endocrine:  No thryomegaly Vascular: No carotid bruits  Cardiac:  normal S1, S2; RRR; no murmur Lungs:  clear to auscultation bilaterally, no wheezing, rhonchi or rales Abd: soft, nontender, no hepatomegaly Ext: no edema Musculoskeletal:  No deformities  Skin: warm and dry Neuro:  CNs 2-12 intact, no  focal abnormalities noted Psych:  Normal affect   EKG:  Sinus tachy, HR 122, normal axis, non specific ST-T wave changes  Labs: No results found for this basename: CKTOTAL:4,CKMB:4,TROPONINI:4 in the last 72 hours  POC Troponin:  0.00  Lab Results  Component Value Date   WBC 7.2 05/30/2011   HGB 16.0* 05/30/2011   HCT 47.0* 05/30/2011   MCV 88.4 05/30/2011   PLT 230 05/30/2011    Lab 05/30/11 0538  NA 142  K 3.0*  CL 105  CO2 --  BUN 11  CREATININE 0.80  CALCIUM --  PROT --  BILITOT --  ALKPHOS --  ALT --  AST --  GLUCOSE 99   No results found for this basename: CHOL, HDL, LDLCALC, TRIG   Lab Results  Component Value Date   DDIMER 0.59* 05/30/2011    Radiology/Studies:   Dg Chest 2 View  05/30/2011   IMPRESSION: No acute abnormalities.  Original Report Authenticated By: Lollie Marrow, M.D.   Ct Angio Chest W/cm &/or Wo Cm  05/30/2011  IMPRESSION: No evidence of pulmonary embolism.  Original Report Authenticated By: Lollie Marrow, M.D.    ASSESSMENT AND PLAN:  1.  Chest Pain  Atypical.  She has very few risk factors (FHx; ? Undiagnosed HTN).  Initial markers negative.  CT neg for PE.  Recommend observation and  cycling enzymes.  If she completely rules out, she can be discharged for outpatient workup (Echo, plain exercise treadmill test) and follow up.  2.  Palpitations  Sinus tachy likely related to anxiety.  Palpitations may also be due to low K+.  Workup as above.  Check TSH.  Also, will need outpatient event  monitor.      3.  Elevated Blood Pressure  Monitor BP.  If remains high, she will need to go home on medication.  Would likely benefit from beta blocker with presenting symptoms.  4.  Hypokalemia  Replacement initiated.  5.  Anxiety/Stress   May be cause of symptoms.  Cardiac workup as above and follow up with PCP as outpatient.   Signed,  Tereso Newcomer, PA-C  05/30/2011, 8:34 AM   Patient seen in ER.  History as elicited by Tereso Newcomer, PA-C.   No significant past cardiac history.  She is physically fit and preparing to run in a 5K race in several weeks. EKG is nonacute.  Physical exam is normal.  Agree with plans to admit, rule out, and then outpatient echo and plain ETT.

## 2011-05-31 LAB — BASIC METABOLIC PANEL
CO2: 24 mEq/L (ref 19–32)
Calcium: 9 mg/dL (ref 8.4–10.5)
Chloride: 104 mEq/L (ref 96–112)
Glucose, Bld: 106 mg/dL — ABNORMAL HIGH (ref 70–99)
Sodium: 138 mEq/L (ref 135–145)

## 2011-05-31 LAB — CARDIAC PANEL(CRET KIN+CKTOT+MB+TROPI)
CK, MB: 1.5 ng/mL (ref 0.3–4.0)
Troponin I: <0.3 ng/mL (ref <0.30)

## 2011-05-31 MED ORDER — LORAZEPAM 0.5 MG PO TABS: 0.5000 mg | ORAL_TABLET | Freq: Every day | ORAL | Status: AC

## 2011-05-31 MED ORDER — DILTIAZEM HCL 30 MG PO TABS: 30.0000 mg | ORAL_TABLET | Freq: Every day | ORAL | Status: AC | PRN

## 2011-05-31 NOTE — Discharge Summary (Addendum)
[facility] Discharge Summary  Patient will need close followup in the outpatient setting for blood pressure and tachycardia. Prescribed diltiazem to take when necessary if blood pressure sustained above 145/85. Patient should follow up with cardiology for stress test. Patient should get a potassium checked in 3-5 days.  Date of Admission: 05/30/2011  3:44 AM Admitter: @ADMITPROV @   Date of Discharge3/25/2013 Attending Physician: Tracy Mura, MD  Patient is a pleasant 41 year old African American female who presented morning 3/24 with palpitations and nonspecific chest pain. She admitted to a lot of psychosocial stressors recently to the point that she had episodes of crying. She noticed that she was significantly tachycardic and blood pressures were high and because of some elements of chest pain and evaluation per below, was admitted  In the Emergency room, and EKG was done showing ST segment depressions in contiguous V4-V6, as well as similar depressions in Lead 2 and 3, with a baseline sinus tachycardia, that totally resolved on administration of Ativan and Metoprolol IV. She received ASA 325, had a CT chest negative for PE, Her Potassium was found slighlty low at 3.0, with the rest of her labs being completely normal  Patient ruled out by cardiac enzymes x3, blood pressure is still elevated. Detailed discussion was undertaken with the patient. We discussed a daily beta blocker, however patient was cautioned regarding depression with this and it was elected to place her on a DASH diet/Mediterranean diet and lifestyle modifications for stress management. A social worker saw her and was able to facilitate this to help her with 3 sources for the same.  Her potassium was slightly low this admission, and she had a TSH that was 1.76. . I would place her on low dose Ativan for now with close followup to her primary care physician to help elucidate some of these findings.  She  did have an echocardiogram this admission which showed a normal ejection fraction of 66 5% no diastolic parameters being abnormal   Procedures Performed and pertinent labs: Dg Chest 2 View  05/30/2011  *RADIOLOGY REPORT*  Clinical Data: Palpitations, tachycardia  CHEST - 2 VIEW  Comparison: None  Findings: Normal heart size, mediastinal contours, and pulmonary vascularity. Lungs clear. No pleural effusion or pneumothorax. No acute osseous findings.  IMPRESSION: No acute abnormalities.  Original Report Authenticated By: Tracy Wade, M.D.   Ct Angio Chest W/cm &/or Wo Cm  05/30/2011  *RADIOLOGY REPORT*  Clinical Data: Palpitations, anterior chest discomfort, tachycardia, shivers  CT ANGIOGRAPHY CHEST  Technique:  Multidetector CT imaging of the chest using the standard protocol during bolus administration of intravenous contrast. Multiplanar reconstructed images including MIPs were obtained and reviewed to evaluate the vascular anatomy.  Contrast: 80mL OMNIPAQUE IOHEXOL 350 MG/ML IV SOLN  Comparison: None  Findings: Aorta normal caliber without aneurysm or dissection. Visualized portion of upper abdomen normal appearance. No thoracic adenopathy. Pulmonary arteries patent. No evidence pulmonary embolism. Lungs clear. No pleural effusion or pneumothorax. Nonspecific focal area of subpleural density identified paraspinal in the medial right upper hemithorax, approximately 15 x 5 mm, fluid attenuation, of doubtful significance. No acute osseous findings.  IMPRESSION: No evidence of pulmonary embolism.  Original Report Authenticated By: Tracy Wade, M.D.    Discharge Vitals & PE:  BP 142/97  Pulse 89  Temp(Src) 98.5 F (36.9 C) (Oral)  Resp 16  Ht 5\' 3"  (1.6 m)  Wt 65.8 kg (145 lb 1 oz)  BMI 25.70 kg/m2  SpO2 98%  LMP 05/09/2011 Alert,  oriented, smiling.  Clinically clear chest S1-S2 no murmur rub or gallop Abdomen soft  Discharge Labs:  Results for orders placed during the hospital  encounter of 05/30/11 (from the past 24 hour(s))  CARDIAC PANEL(CRET KIN+CKTOT+MB+TROPI)     Status: Normal   Collection Time   05/30/11  3:46 PM      Component Value Range   Total CK 114  7 - 177 (U/L)   CK, MB 1.6  0.3 - 4.0 (ng/mL)   Troponin I <0.30  <0.30 (ng/mL)   Relative Index 1.4  0.0 - 2.5   CARDIAC PANEL(CRET KIN+CKTOT+MB+TROPI)     Status: Normal   Collection Time   05/30/11 11:28 PM      Component Value Range   Total CK 110  7 - 177 (U/L)   CK, MB 1.5  0.3 - 4.0 (ng/mL)   Troponin I <0.30  <0.30 (ng/mL)   Relative Index 1.4  0.0 - 2.5     Disposition and follow-up:   [name] was discharged from in good condition.    Follow-up Appointments: Follow-up Information    Follow up with Tracy Divine, MD in 1 week.   Contact information:   [address], [address] [place] [address] [phone]       Call Tracy Clement, MD. (get the Stress test set-up, and monitor setup as well)    Contact information:   [address] [place] [address] [phone]           Discharge Medications: Medication List  As of 05/31/2011  9:39 AM   STOP taking these medications         sodium chloride 0.65 % nasal spray         TAKE these medications         calcium carbonate 500 MG chewable tablet   Commonly known as: TUMS - dosed in mg elemental calcium   Chew 1 tablet by mouth 3 (three) times daily. Bloating/ burning sensation      diltiazem 30 MG tablet   Commonly known as: CARDIZEM   Take 1 tablet (30 mg total) by mouth daily as needed (for blood pressures over 145/85).      fluticasone 50 MCG/ACT nasal spray   Commonly known as: FLONASE   Place 2 sprays into the nose daily.      LORazepam 0.5 MG tablet   Commonly known as: ATIVAN   Take 1 tablet (0.5 mg total) by mouth at bedtime.      mulitivitamin with minerals Tabs   Take 1 tablet by mouth daily.      ranitidine 75 MG tablet    Commonly known as: ZANTAC   Take 75 mg by mouth once.           Medications Discontinued During This Encounter  Medication Reason  . aspirin tablet 324 mg Formulary change  . sodium chloride (OCEAN) 0.65 % nasal spray Stop Taking at Discharge    Signed: [facility] 05/31/2011, 9:39 AM

## 2011-05-31 NOTE — Progress Notes (Signed)
   Subjective:  Feels well.  No further chest burning. BP at rest still running slightly high. Rhythm NSR Echo normal.   Objective:  Vital Signs in the last 24 hours: Temp:  [97.4 F (36.3 C)-98.8 F (37.1 C)] 98.5 F (36.9 C) (03/25 0700) Pulse Rate:  [84-90] 89  (03/25 0700) Resp:  [16-18] 16  (03/25 0700) BP: (129-152)/(87-97) 142/97 mmHg (03/25 0700) SpO2:  [95 %-100 %] 98 % (03/25 0700) Weight:  [65.8 kg (145 lb 1 oz)] 65.8 kg (145 lb 1 oz) (03/24 1129)  Intake/Output from previous day: 03/24 0701 - 03/25 0700 In: 720 [P.O.:720] Out: -  Intake/Output from this shift:       . aspirin EC  81 mg Oral Daily  . calcium carbonate  1 tablet Oral TID  . fluticasone  2 spray Each Nare Daily  . LORazepam  0.5 mg Oral QHS  . LORazepam  1 mg Oral STAT  . mulitivitamin with minerals  1 tablet Oral Daily      Physical Exam: The patient appears to be in no distress.  Head and neck exam reveals that the pupils are equal and reactive.  The extraocular movements are full.  There is no scleral icterus.  Mouth and pharynx are benign.  No lymphadenopathy.  No carotid bruits.  The jugular venous pressure is normal.  Thyroid is not enlarged or tender.  Chest is clear to percussion and auscultation.  No rales or rhonchi.  Expansion of the chest is symmetrical.  Heart reveals no abnormal lift or heave.  First and second heart sounds are normal.  There is no murmur gallop rub or click.  The abdomen is soft and nontender.  Bowel sounds are normoactive.  There is no hepatosplenomegaly or mass.  There are no abdominal bruits.  Extremities reveal no phlebitis or edema.  Pedal pulses are good.  There is no cyanosis or clubbing.  Neurologic exam is normal strength and no lateralizing weakness.  No sensory deficits.  Integument reveals no rash  Lab Results:  Basename 05/30/11 0538 05/30/11 0459  WBC -- 7.2  HGB 16.0* 15.9*  PLT -- 230    Basename 05/30/11 0538  NA 142  K 3.0*  CL  105  CO2 --  GLUCOSE 99  BUN 11  CREATININE 0.80    Basename 05/30/11 2328 05/30/11 1546  TROPONINI <0.30 <0.30   Hepatic Function Panel No results found for this basename: PROT,ALBUMIN,AST,ALT,ALKPHOS,BILITOT,BILIDIR,IBILI in the last 72 hours No results found for this basename: CHOL in the last 72 hours No results found for this basename: PROTIME in the last 72 hours  Imaging: Imaging results have been reviewed  Cardiac Studies:  Assessment/Plan:  Patient Active Hospital Problem List:  1) Chest pain     Enzymes and EKG normal  2)  Hypertension      Still labile      Hypokalemic on admission--needs followup K+.  Will order for this am      If persistent low potassium consider hyperaldosteronism as cause of HBP  Plan:  No further cardiac work-up in hospital.  Can probably go home later today.    Please contact our office to set up outpatient plain treadmill test and to set up an outpatient Holter monitor. Avoid jogging until after the ETT.  Walking for exercise okay   LOS: 1 day    Cassell Clement 05/31/2011, 7:39 AM

## 2011-06-01 NOTE — Progress Notes (Signed)
Utilization Review Completed.Tracy Wade T3/26/2013   

## 2011-06-10 ENCOUNTER — Telehealth: Payer: Self-pay | Admitting: Nurse Practitioner

## 2011-06-10 ENCOUNTER — Ambulatory Visit (INDEPENDENT_AMBULATORY_CARE_PROVIDER_SITE_OTHER): Payer: BC Managed Care – PPO | Admitting: Cardiology

## 2011-06-10 ENCOUNTER — Encounter: Payer: BC Managed Care – PPO | Admitting: Cardiology

## 2011-06-10 DIAGNOSIS — R000 Tachycardia, unspecified: Secondary | ICD-10-CM

## 2011-06-10 NOTE — Procedures (Signed)
Exercise Treadmill Test  Pre-Exercise Testing Evaluation Rhythm: normal sinus  Rate: 81   PR:  .10 QRS:  .07  QT:  .35 QTc: .41     Test  Exercise Tolerance Test Ordering MD:  Cassell Clement , MD  Interpreting MD:  Park Liter, MDl  Unique Test No: 1  Treadmill:  1  Indication for ETT: Tachycardia  Contraindication to ETT: No   Stress Modality: exercise - treadmill  Cardiac Imaging Performed: non   Protocol: standard Bruce - maximal  Max BP:  200/94  Max MPHR (bpm):  179 85% MPR (bpm):  152  MPHR obtained (bpm):  187 % MPHR obtained:  103%  Reached 85% MPHR (min:sec):  5:00 Total Exercise Time (min-sec):  10:26  Workload in METS:  12.3 Borg Scale: 14  Reason ETT Terminated:  desired heart rate attained    ST Segment Analysis At Rest: normal ST segments - no evidence of significant ST depression With Exercise: no evidence of significant ST depression  Other Information Arrhythmia:  No Angina during ETT:  absent (0) Quality of ETT:  diagnostic  ETT Interpretation:  normal - no evidence of ischemia by ST analysis  Comments: No evidence of ischemia.  Excellent exercise tolerance. No arrhythmia. Normal ETT  Recommendations: Continue present meds.  No activity limitations.

## 2011-06-10 NOTE — Telephone Encounter (Signed)
WE DIDN'T SEE THIS PATIENT TODAY, DR. Patty Sermons DID A TREADMILL ON HER TODAY

## 2011-06-10 NOTE — Telephone Encounter (Signed)
New msg Pt had some questions about visit she had scheduled for today. Please call

## 2013-01-16 ENCOUNTER — Encounter (HOSPITAL_COMMUNITY): Payer: Self-pay | Admitting: Emergency Medicine

## 2013-01-16 ENCOUNTER — Emergency Department (HOSPITAL_COMMUNITY): Payer: BC Managed Care – PPO

## 2013-01-16 ENCOUNTER — Emergency Department (HOSPITAL_COMMUNITY)
Admission: EM | Admit: 2013-01-16 | Discharge: 2013-01-16 | Disposition: A | Discharge status: Home / Self Care | Payer: BC Managed Care – PPO | Attending: Emergency Medicine | Admitting: Emergency Medicine

## 2013-01-16 DIAGNOSIS — Z79899 Other long term (current) drug therapy: Secondary | ICD-10-CM | POA: Insufficient documentation

## 2013-01-16 DIAGNOSIS — I10 Essential (primary) hypertension: Secondary | ICD-10-CM | POA: Insufficient documentation

## 2013-01-16 DIAGNOSIS — J029 Acute pharyngitis, unspecified: Secondary | ICD-10-CM | POA: Insufficient documentation

## 2013-01-16 DIAGNOSIS — R000 Tachycardia, unspecified: Secondary | ICD-10-CM | POA: Insufficient documentation

## 2013-01-16 DIAGNOSIS — K219 Gastro-esophageal reflux disease without esophagitis: Secondary | ICD-10-CM | POA: Insufficient documentation

## 2013-01-16 HISTORY — DX: Gastro-esophageal reflux disease without esophagitis: K21.9

## 2013-01-16 HISTORY — DX: Essential (primary) hypertension: I10

## 2013-01-16 LAB — POCT I-STAT TROPONIN I: Troponin i, poc: 0 ng/mL (ref 0.00–0.08)

## 2013-01-16 MED ORDER — GI COCKTAIL ~~LOC~~
30.0000 mL | Freq: Once | ORAL | Status: AC
  Administered 2013-01-16: 30 mL via ORAL
  Filled 2013-01-16 (×2): qty 30

## 2013-01-16 NOTE — ED Notes (Signed)
Pt now requesting GI cocktail now due to discomfort. Dr. Wilkie Aye made aware.

## 2013-01-16 NOTE — ED Notes (Signed)
Pt. woke up this evening with gastric reflux , transient upper abdominal pain with dry throat / post nasal drip . Denies pain at arrival , respirations unlabored .

## 2013-01-16 NOTE — ED Notes (Signed)
Dr. Horton at bedside. 

## 2013-01-16 NOTE — ED Provider Notes (Signed)
CSN: [id number]     Arrival date & time 01/16/13  0119 History   First MD Initiated Contact with Patient 01/16/13 0128     Chief Complaint  Patient presents with  . Gastrophageal Reflux   (Consider location/radiation/quality/duration/timing/severity/associated sxs/prior Treatment) HPI  This is a 42 year old female with a history of hypertension and reflux who presents with upper abdominal pain and sore throat. Patient reports that she's been treated with GERD for last week with Prilosec. She does not have any burning in her chest but has had "soreness and fullness" in her epigastrium. Tonight patient fell sleep and when she woke up she noted increase feeling of soreness and fullness in her abdomen. She also states that she noted she is having difficulty swallowing and had burning pain in the back of her throat. This is new. She denies any chest pain or shortness of breath. She states that the pain in the back of her throat is getting better.  Past Medical History  Diagnosis Date  . Hypertension   . GERD (gastroesophageal reflux disease)    Past Surgical History  Procedure Laterality Date  . Cesarean section      and numerous other gynecological procedures  . Myomectomy      fibroid tumor   Family History  Problem Relation Age of Onset  . Heart failure Father   . Heart attack Father 76  . Stroke Father    History  Substance Use Topics  . Smoking status: Never Smoker   . Smokeless tobacco: Never Used  . Alcohol Use: Yes     Comment: rare glass of wine   OB History   Grav Para Term Preterm Abortions TAB SAB Ect Mult Living                 Review of Systems  Constitutional: Negative for fever.  HENT: Positive for sore throat.   Respiratory: Negative for cough, chest tightness and shortness of breath.   Cardiovascular: Negative for chest pain.  Gastrointestinal: Positive for abdominal pain. Negative for nausea and vomiting.  All other systems reviewed and are  negative.    Allergies  Erythromycin and Sulfa antibiotics  Home Medications   Current Outpatient Rx  Name  Route  Sig  Dispense  Refill  . amLODipine (NORVASC) 5 MG tablet   Oral   Take 5 mg by mouth daily.          Marland Kitchen omeprazole (PRILOSEC) 20 MG capsule   Oral   Take 20 mg by mouth daily.           BP 138/83  Pulse 97  Temp(Src) 98.4 F (36.9 C) (Oral)  Resp 13  SpO2 100%  LMP 12/15/2012 Physical Exam  Nursing note and vitals reviewed. Constitutional: She is oriented to person, place, and time. She appears well-developed and well-nourished. No distress.  HENT:  Head: Normocephalic and atraumatic.  Mouth/Throat: Oropharynx is clear and moist. No oropharyngeal exudate.  Neck: Neck supple.  Cardiovascular: Regular rhythm and normal heart sounds.   Tachycardia  Pulmonary/Chest: Effort normal and breath sounds normal. No respiratory distress. She has no wheezes.  Abdominal: Soft. Bowel sounds are normal. She exhibits no distension. There is no tenderness.  Lymphadenopathy:    She has no cervical adenopathy.  Neurological: She is alert and oriented to person, place, and time.  Skin: Skin is warm and dry.  Psychiatric: She has a normal mood and affect.    ED Course  Procedures (including critical care time) Labs  Review Labs Reviewed  POCT I-STAT TROPONIN I   Imaging Review Dg Chest 2 View  01/16/2013   CLINICAL DATA:  Shortness of breath  EXAM: CHEST  2 VIEW  COMPARISON:  Prior CT from 05/30/2011  FINDINGS: The cardiac and mediastinal silhouettes are within normal limits.  The lungs are normally inflated. No airspace consolidation, pleural effusion, or pulmonary edema is identified. There is no pneumothorax.  No acute osseous abnormality identified.  IMPRESSION: No radiographic evidence of active cardiopulmonary disease.   Electronically Signed   By: Rise Mu M.D.   On: 01/16/2013 02:27    EKG Interpretation     Ventricular Rate:  102 PR  Interval:  134 QRS Duration: 90 QT Interval:  348 QTC Calculation: 453 R Axis:   82 Text Interpretation:  Sinus tachycardia Borderline T abnormalities, anterior leads            MDM   1. Gastroesophageal reflux    This is a 42 year old female who presents with reflux symptoms and new burning of her throat. She is nontoxic-appearing on exam. She has no evidence of stridor or hoarseness. Vital signs are reassuring. Patient is mildly tachycardic and gets more tachycardic as I talk to her. She endorses anxiety.  Patient is low risk for ACS but does have hypertension. Screening EKG is reassuring. Given the recurrence of pain and duration of pain, 1 troponin was obtained and is negative. Chest x-ray is unremarkable. Low suspicion for ACS.   Patient was given a GI cocktail which she initially refused. She did take it prior to discharge. At this time she is continues to be nontoxic-appearing and her exam is reassuring.  I have low suspicion for neck infection with her sore throat given the acute onset of her symptoms and lack of physical findings. Patient will followup with her PCP.  After history, exam, and medical workup I feel the patient has been appropriately medically screened and is safe for discharge home. Pertinent diagnoses were discussed with the patient. Patient was given return precautions.   Shon Baton, MD 01/16/13 [phone]

## 2013-02-15 ENCOUNTER — Other Ambulatory Visit: Payer: Self-pay | Admitting: Gastroenterology

## 2013-02-15 DIAGNOSIS — K219 Gastro-esophageal reflux disease without esophagitis: Secondary | ICD-10-CM

## 2013-02-15 DIAGNOSIS — R109 Unspecified abdominal pain: Secondary | ICD-10-CM

## 2013-02-16 ENCOUNTER — Other Ambulatory Visit: Payer: Self-pay | Admitting: Gastroenterology

## 2013-02-16 ENCOUNTER — Ambulatory Visit
Admission: RE | Admit: 2013-02-16 | Discharge: 2013-02-16 | Disposition: A | Discharge status: Home / Self Care | Payer: BC Managed Care – PPO | Source: Ambulatory Visit | Attending: Gastroenterology | Admitting: Gastroenterology

## 2013-02-16 DIAGNOSIS — K219 Gastro-esophageal reflux disease without esophagitis: Secondary | ICD-10-CM

## 2013-02-16 DIAGNOSIS — R109 Unspecified abdominal pain: Secondary | ICD-10-CM

## 2013-06-01 ENCOUNTER — Ambulatory Visit
Admission: RE | Admit: 2013-06-01 | Discharge: 2013-06-01 | Disposition: A | Discharge status: Home / Self Care | Payer: BC Managed Care – PPO | Source: Ambulatory Visit | Attending: Family Medicine | Admitting: Family Medicine

## 2013-06-01 ENCOUNTER — Other Ambulatory Visit: Payer: Self-pay | Admitting: Family Medicine

## 2013-06-01 DIAGNOSIS — R1031 Right lower quadrant pain: Secondary | ICD-10-CM

## 2015-09-29 ENCOUNTER — Emergency Department (HOSPITAL_COMMUNITY): Payer: BC Managed Care – PPO | Admitting: Certified Registered Nurse Anesthetist

## 2015-09-29 ENCOUNTER — Encounter (HOSPITAL_COMMUNITY): Payer: Self-pay | Admitting: *Deleted

## 2015-09-29 ENCOUNTER — Emergency Department (HOSPITAL_COMMUNITY): Payer: BC Managed Care – PPO

## 2015-09-29 ENCOUNTER — Inpatient Hospital Stay (HOSPITAL_COMMUNITY)
Admission: EM | Admit: 2015-09-29 | Discharge: 2015-10-07 | DRG: 501 | Disposition: A | Discharge status: Home / Self Care | Payer: BC Managed Care – PPO | Attending: Orthopedic Surgery | Admitting: Orthopedic Surgery

## 2015-09-29 ENCOUNTER — Encounter (HOSPITAL_COMMUNITY)
Admission: EM | Disposition: A | Discharge status: Home / Self Care | Payer: Self-pay | Source: Home / Self Care | Attending: Orthopedic Surgery

## 2015-09-29 DIAGNOSIS — K219 Gastro-esophageal reflux disease without esophagitis: Secondary | ICD-10-CM | POA: Diagnosis present

## 2015-09-29 DIAGNOSIS — I10 Essential (primary) hypertension: Secondary | ICD-10-CM | POA: Diagnosis present

## 2015-09-29 DIAGNOSIS — M25522 Pain in left elbow: Secondary | ICD-10-CM | POA: Diagnosis present

## 2015-09-29 DIAGNOSIS — S45192A Other specified injury of brachial artery, left side, initial encounter: Secondary | ICD-10-CM | POA: Diagnosis not present

## 2015-09-29 DIAGNOSIS — Y9352 Activity, horseback riding: Secondary | ICD-10-CM

## 2015-09-29 DIAGNOSIS — S53105A Unspecified dislocation of left ulnohumeral joint, initial encounter: Secondary | ICD-10-CM | POA: Diagnosis present

## 2015-09-29 DIAGNOSIS — R52 Pain, unspecified: Secondary | ICD-10-CM

## 2015-09-29 DIAGNOSIS — Z881 Allergy status to other antibiotic agents status: Secondary | ICD-10-CM | POA: Diagnosis not present

## 2015-09-29 DIAGNOSIS — Z01818 Encounter for other preprocedural examination: Secondary | ICD-10-CM

## 2015-09-29 DIAGNOSIS — S45112A Laceration of brachial artery, left side, initial encounter: Secondary | ICD-10-CM | POA: Diagnosis present

## 2015-09-29 DIAGNOSIS — S42402B Unspecified fracture of lower end of left humerus, initial encounter for open fracture: Secondary | ICD-10-CM

## 2015-09-29 DIAGNOSIS — Z882 Allergy status to sulfonamides status: Secondary | ICD-10-CM | POA: Diagnosis not present

## 2015-09-29 DIAGNOSIS — S52132C Displaced fracture of neck of left radius, initial encounter for open fracture type IIIA, IIIB, or IIIC: Secondary | ICD-10-CM | POA: Diagnosis present

## 2015-09-29 DIAGNOSIS — S45102A Unspecified injury of brachial artery, left side, initial encounter: Secondary | ICD-10-CM | POA: Diagnosis present

## 2015-09-29 HISTORY — PX: I & D EXTREMITY: SHX5045

## 2015-09-29 HISTORY — PX: ARTERY REPAIR: SHX559

## 2015-09-29 HISTORY — PX: ENDOVEIN HARVEST OF GREATER SAPHENOUS VEIN: SHX5059

## 2015-09-29 HISTORY — PX: CLOSED REDUCTION ELBOW FRACTURE: SHX930

## 2015-09-29 LAB — COMPREHENSIVE METABOLIC PANEL
ALK PHOS: 56 U/L (ref 38–126)
ALT: 20 U/L (ref 14–54)
AST: 29 U/L (ref 15–41)
Albumin: 3.9 g/dL (ref 3.5–5.0)
Anion gap: 11 (ref 5–15)
BUN: 16 mg/dL (ref 6–20)
CALCIUM: 8.8 mg/dL — AB (ref 8.9–10.3)
CO2: 20 mmol/L — ABNORMAL LOW (ref 22–32)
Chloride: 107 mmol/L (ref 101–111)
Creatinine, Ser: 0.96 mg/dL (ref 0.44–1.00)
Glucose, Bld: 145 mg/dL — ABNORMAL HIGH (ref 65–99)
Potassium: 3.6 mmol/L (ref 3.5–5.1)
Sodium: 138 mmol/L (ref 135–145)
Total Bilirubin: 0.6 mg/dL (ref 0.3–1.2)
Total Protein: 7 g/dL (ref 6.5–8.1)

## 2015-09-29 LAB — PROTIME-INR
INR: 0.94 (ref 0.00–1.49)
PROTHROMBIN TIME: 12.8 s (ref 11.6–15.2)

## 2015-09-29 LAB — ETHANOL

## 2015-09-29 LAB — I-STAT CHEM 8, ED
BUN: 17 mg/dL (ref 6–20)
CHLORIDE: 105 mmol/L (ref 101–111)
Calcium, Ion: 0.88 mmol/L — ABNORMAL LOW (ref 1.13–1.30)
Creatinine, Ser: 0.8 mg/dL (ref 0.44–1.00)
GLUCOSE: 149 mg/dL — AB (ref 65–99)
HCT: 44 % (ref 36.0–46.0)
Hemoglobin: 15 g/dL (ref 12.0–15.0)
POTASSIUM: 3.4 mmol/L — AB (ref 3.5–5.1)
Sodium: 139 mmol/L (ref 135–145)
TCO2: 19 mmol/L (ref 0–100)

## 2015-09-29 LAB — CBC
HCT: 42.4 % (ref 36.0–46.0)
Hemoglobin: 14.3 g/dL (ref 12.0–15.0)
MCH: 30.9 pg (ref 26.0–34.0)
MCHC: 33.7 g/dL (ref 30.0–36.0)
MCV: 91.6 fL (ref 78.0–100.0)
PLATELETS: 244 10*3/uL (ref 150–400)
RBC: 4.63 MIL/uL (ref 3.87–5.11)
RDW: 12.4 % (ref 11.5–15.5)
WBC: 16.2 10*3/uL — AB (ref 4.0–10.5)

## 2015-09-29 LAB — I-STAT CG4 LACTIC ACID, ED: Lactic Acid, Venous: 4.55 mmol/L (ref 0.5–1.9)

## 2015-09-29 LAB — I-STAT BETA HCG BLOOD, ED (MC, WL, AP ONLY)

## 2015-09-29 LAB — ABO/RH: ABO/RH(D): AB POS

## 2015-09-29 LAB — CDS SEROLOGY

## 2015-09-29 SURGERY — IRRIGATION AND DEBRIDEMENT EXTREMITY
Anesthesia: General | Site: Elbow | Laterality: Left

## 2015-09-29 MED ORDER — HYDROMORPHONE HCL 1 MG/ML IJ SOLN
INTRAMUSCULAR | Status: AC
  Administered 2015-09-29: 2 mg
  Filled 2015-09-29: qty 1

## 2015-09-29 MED ORDER — LIDOCAINE HCL (CARDIAC) 20 MG/ML IV SOLN
INTRAVENOUS | Status: DC | PRN
  Administered 2015-09-29: 80 mg via INTRAVENOUS

## 2015-09-29 MED ORDER — TETANUS-DIPHTH-ACELL PERTUSSIS 5-2.5-18.5 LF-MCG/0.5 IM SUSP
0.5000 mL | Freq: Once | INTRAMUSCULAR | Status: AC
  Administered 2015-09-29: 0.5 mL via INTRAMUSCULAR
  Filled 2015-09-29: qty 0.5

## 2015-09-29 MED ORDER — FENTANYL CITRATE (PF) 250 MCG/5ML IJ SOLN
INTRAMUSCULAR | Status: AC
  Filled 2015-09-29: qty 5

## 2015-09-29 MED ORDER — ARTIFICIAL TEARS OP OINT
TOPICAL_OINTMENT | OPHTHALMIC | Status: AC
  Filled 2015-09-29: qty 3.5

## 2015-09-29 MED ORDER — GENTAMICIN SULFATE 40 MG/ML IJ SOLN
7.0000 mg/kg | INTRAVENOUS | Status: DC
  Administered 2015-09-29: 480 mg via INTRAVENOUS
  Administered 2015-09-30 – 2015-10-01 (×2): 460 mg via INTRAVENOUS
  Filled 2015-09-29 (×5): qty 11.5

## 2015-09-29 MED ORDER — HYDROMORPHONE HCL 1 MG/ML IJ SOLN
INTRAMUSCULAR | Status: AC
  Administered 2015-09-29: 22:00:00
  Filled 2015-09-29: qty 1

## 2015-09-29 MED ORDER — SUCCINYLCHOLINE CHLORIDE 200 MG/10ML IV SOSY
PREFILLED_SYRINGE | INTRAVENOUS | Status: AC
  Filled 2015-09-29: qty 10

## 2015-09-29 MED ORDER — HYDROMORPHONE HCL 1 MG/ML IJ SOLN
INTRAMUSCULAR | Status: AC | PRN
  Administered 2015-09-29: 2 mg via INTRAVENOUS

## 2015-09-29 MED ORDER — DEXAMETHASONE SODIUM PHOSPHATE 10 MG/ML IJ SOLN
INTRAMUSCULAR | Status: AC
  Filled 2015-09-29: qty 1

## 2015-09-29 MED ORDER — ONDANSETRON HCL 4 MG/2ML IJ SOLN
INTRAMUSCULAR | Status: AC
  Administered 2015-09-30: 4 mg
  Filled 2015-09-29: qty 2

## 2015-09-29 MED ORDER — HYDROMORPHONE HCL 1 MG/ML IJ SOLN
INTRAMUSCULAR | Status: AC | PRN
  Administered 2015-09-29: 1 mg via INTRAVENOUS

## 2015-09-29 MED ORDER — SUCCINYLCHOLINE CHLORIDE 20 MG/ML IJ SOLN
INTRAMUSCULAR | Status: DC | PRN
  Administered 2015-09-29: 120 mg via INTRAVENOUS

## 2015-09-29 MED ORDER — PROPOFOL 10 MG/ML IV BOLUS
INTRAVENOUS | Status: DC | PRN
  Administered 2015-09-29: 150 mg via INTRAVENOUS
  Administered 2015-09-29: 30 mg via INTRAVENOUS

## 2015-09-29 MED ORDER — MIDAZOLAM HCL 5 MG/5ML IJ SOLN
INTRAMUSCULAR | Status: DC | PRN
  Administered 2015-09-29: 2 mg via INTRAVENOUS

## 2015-09-29 MED ORDER — LIDOCAINE 2% (20 MG/ML) 5 ML SYRINGE
INTRAMUSCULAR | Status: AC
  Filled 2015-09-29: qty 5

## 2015-09-29 MED ORDER — ONDANSETRON HCL 4 MG/2ML IJ SOLN
INTRAMUSCULAR | Status: AC
  Filled 2015-09-29: qty 2

## 2015-09-29 MED ORDER — FENTANYL CITRATE (PF) 100 MCG/2ML IJ SOLN
INTRAMUSCULAR | Status: DC | PRN
  Administered 2015-09-29: 100 ug via INTRAVENOUS
  Administered 2015-09-30 (×2): 50 ug via INTRAVENOUS

## 2015-09-29 MED ORDER — MIDAZOLAM HCL 2 MG/2ML IJ SOLN
INTRAMUSCULAR | Status: AC
  Filled 2015-09-29: qty 2

## 2015-09-29 MED ORDER — DEXAMETHASONE SODIUM PHOSPHATE 10 MG/ML IJ SOLN
INTRAMUSCULAR | Status: DC | PRN
  Administered 2015-09-29: 5 mg via INTRAVENOUS

## 2015-09-29 MED ORDER — LACTATED RINGERS IV SOLN
INTRAVENOUS | Status: DC | PRN
  Administered 2015-09-29 – 2015-09-30 (×2): via INTRAVENOUS

## 2015-09-29 MED ORDER — ONDANSETRON HCL 4 MG/2ML IJ SOLN
INTRAMUSCULAR | Status: DC | PRN
  Administered 2015-09-29 – 2015-09-30 (×2): 4 mg via INTRAVENOUS

## 2015-09-29 MED ORDER — CEFAZOLIN SODIUM-DEXTROSE 2-4 GM/100ML-% IV SOLN
2.0000 g | Freq: Once | INTRAVENOUS | Status: AC
  Administered 2015-09-29: 2 g via INTRAVENOUS
  Filled 2015-09-29: qty 100

## 2015-09-29 MED ORDER — PROPOFOL 10 MG/ML IV BOLUS
INTRAVENOUS | Status: AC
  Filled 2015-09-29: qty 20

## 2015-09-29 MED ORDER — SODIUM CHLORIDE 0.9 % IV SOLN
INTRAVENOUS | Status: AC | PRN
  Administered 2015-09-29: 1000 mL via INTRAVENOUS

## 2015-09-29 MED ORDER — HYDROMORPHONE HCL 1 MG/ML IJ SOLN
INTRAMUSCULAR | Status: AC
  Administered 2015-09-29: 22:00:00
  Filled 2015-09-29: qty 2

## 2015-09-29 SURGICAL SUPPLY — 86 items
APL SKNCLS STERI-STRIP NONHPOA (GAUZE/BANDAGES/DRESSINGS) ×2
BAG DECANTER FOR FLEXI CONT (MISCELLANEOUS) ×2 IMPLANT
BANDAGE ELASTIC 4 VELCRO ST LF (GAUZE/BANDAGES/DRESSINGS) ×4 IMPLANT
BENZOIN TINCTURE PRP APPL 2/3 (GAUZE/BANDAGES/DRESSINGS) ×2 IMPLANT
BLADE SURG 11 STRL SS (BLADE) ×2 IMPLANT
BNDG GAUZE ELAST 4 BULKY (GAUZE/BANDAGES/DRESSINGS) ×4 IMPLANT
BOOT SUTURE AID YELLOW STND (SUTURE) ×2 IMPLANT
CANNULA VESSEL 3MM 2 BLNT TIP (CANNULA) ×2 IMPLANT
CATH EMB 3FR 40CM (CATHETERS) ×2 IMPLANT
CATH ROBINSON RED A/P 14FR (CATHETERS) ×4 IMPLANT
CLIP LIGATING EXTRA MED SLVR (CLIP) ×2 IMPLANT
CLIP LIGATING EXTRA SM BLUE (MISCELLANEOUS) ×2 IMPLANT
CLOSURE STERI-STRIP 1/4X4 (GAUZE/BANDAGES/DRESSINGS) ×2 IMPLANT
COVER MAYO STAND STRL (DRAPES) ×2 IMPLANT
CUFF TOURNIQUET SINGLE 18IN (TOURNIQUET CUFF) ×6 IMPLANT
CUFF TOURNIQUET SINGLE 24IN (TOURNIQUET CUFF) IMPLANT
CUFF TOURNIQUET SINGLE 34IN LL (TOURNIQUET CUFF) IMPLANT
CUFF TOURNIQUET SINGLE 44IN (TOURNIQUET CUFF) IMPLANT
DRAPE ORTHO SPLIT 77X108 STRL (DRAPES) ×4
DRAPE SURG 17X23 STRL (DRAPES) ×6 IMPLANT
DRAPE SURG ORHT 6 SPLT 77X108 (DRAPES) IMPLANT
DRAPE U-SHAPE 47X51 STRL (DRAPES) ×4 IMPLANT
DRSG COVADERM 4X8 (GAUZE/BANDAGES/DRESSINGS) ×2 IMPLANT
DRSG EMULSION OIL 3X3 NADH (GAUZE/BANDAGES/DRESSINGS) ×4 IMPLANT
DRSG PAD ABDOMINAL 8X10 ST (GAUZE/BANDAGES/DRESSINGS) ×4 IMPLANT
ELECT REM PT RETURN 9FT ADLT (ELECTROSURGICAL)
ELECTRODE REM PT RTRN 9FT ADLT (ELECTROSURGICAL) IMPLANT
FACESHIELD WRAPAROUND (MASK) ×4 IMPLANT
FACESHIELD WRAPAROUND OR TEAM (MASK) ×2 IMPLANT
GAUZE SPONGE 4X4 12PLY STRL (GAUZE/BANDAGES/DRESSINGS) ×4 IMPLANT
GAUZE XEROFORM 1X8 LF (GAUZE/BANDAGES/DRESSINGS) ×2 IMPLANT
GEL ULTRASOUND 20GR AQUASONIC (MISCELLANEOUS) ×2 IMPLANT
GLOVE BIOGEL PI IND STRL 7.5 (GLOVE) ×2 IMPLANT
GLOVE BIOGEL PI IND STRL 8 (GLOVE) ×2 IMPLANT
GLOVE BIOGEL PI INDICATOR 7.5 (GLOVE) ×10
GLOVE BIOGEL PI INDICATOR 8 (GLOVE) ×2
GLOVE ECLIPSE 7.5 STRL STRAW (GLOVE) ×2 IMPLANT
GLOVE ECLIPSE 8.0 STRL XLNG CF (GLOVE) ×4 IMPLANT
GLOVE ORTHO TXT STRL SZ7.5 (GLOVE) ×6 IMPLANT
GLOVE SS BIOGEL STRL SZ 7.5 (GLOVE) IMPLANT
GLOVE SUPERSENSE BIOGEL SZ 7.5 (GLOVE) ×2
GLOVE SURG ORTHO 8.0 STRL STRW (GLOVE) ×4 IMPLANT
GOWN STRL REIN 3XL XLG LVL4 (GOWN DISPOSABLE) ×4 IMPLANT
GOWN STRL REUS W/ TWL LRG LVL3 (GOWN DISPOSABLE) ×6 IMPLANT
GOWN STRL REUS W/TWL LRG LVL3 (GOWN DISPOSABLE) ×12
HANDPIECE INTERPULSE COAX TIP (DISPOSABLE)
KIT BASIN OR (CUSTOM PROCEDURE TRAY) ×4 IMPLANT
KIT ROOM TURNOVER OR (KITS) ×4 IMPLANT
LOOP VESSEL MAXI BLUE (MISCELLANEOUS) ×2 IMPLANT
LOOP VESSEL SUPERMAXI WHITE (MISCELLANEOUS) ×2 IMPLANT
MANIFOLD NEPTUNE II (INSTRUMENTS) ×4 IMPLANT
NS IRRIG 1000ML POUR BTL (IV SOLUTION) ×6 IMPLANT
PACK ORTHO EXTREMITY (CUSTOM PROCEDURE TRAY) ×4 IMPLANT
PAD ARMBOARD 7.5X6 YLW CONV (MISCELLANEOUS) ×8 IMPLANT
PADDING CAST ABS 4INX4YD NS (CAST SUPPLIES) ×2
PADDING CAST ABS COTTON 4X4 ST (CAST SUPPLIES) IMPLANT
PILLOW ARM CARTER ADULT (MISCELLANEOUS) ×2 IMPLANT
SET HNDPC FAN SPRY TIP SCT (DISPOSABLE) IMPLANT
SPLINT FIBERGLASS 3X12 (CAST SUPPLIES) ×2 IMPLANT
SPONGE GAUZE 4X4 12PLY STER LF (GAUZE/BANDAGES/DRESSINGS) ×2 IMPLANT
SPONGE LAP 18X18 X RAY DECT (DISPOSABLE) ×4 IMPLANT
SPONGE LAP 4X18 X RAY DECT (DISPOSABLE) ×4 IMPLANT
STOCKINETTE IMPERVIOUS 9X36 MD (GAUZE/BANDAGES/DRESSINGS) ×4 IMPLANT
SUCTION FRAZIER HANDLE 10FR (MISCELLANEOUS)
SUCTION TUBE FRAZIER 10FR DISP (MISCELLANEOUS) IMPLANT
SUT ETHILON 3 0 PS 1 (SUTURE) ×4 IMPLANT
SUT PROLENE 6 0 CC (SUTURE) ×8 IMPLANT
SUT SILK 2 0 (SUTURE) ×4
SUT SILK 2 0 FS (SUTURE) ×6 IMPLANT
SUT SILK 2-0 18XBRD TIE 12 (SUTURE) IMPLANT
SUT SILK 3 0 (SUTURE) ×4
SUT SILK 3-0 18XBRD TIE 12 (SUTURE) IMPLANT
SUT VIC AB 2-0 CT1 27 (SUTURE) ×4
SUT VIC AB 2-0 CT1 TAPERPNT 27 (SUTURE) IMPLANT
SUT VIC AB 3-0 PS2 18 (SUTURE) ×4
SUT VIC AB 3-0 PS2 18XBRD (SUTURE) IMPLANT
SYR 3ML LL SCALE MARK (SYRINGE) ×2 IMPLANT
SYRINGE 20CC LL (MISCELLANEOUS) ×4 IMPLANT
TOWEL OR 17X24 6PK STRL BLUE (TOWEL DISPOSABLE) ×4 IMPLANT
TOWEL OR 17X26 10 PK STRL BLUE (TOWEL DISPOSABLE) ×4 IMPLANT
TUBE ANAEROBIC SPECIMEN COL (MISCELLANEOUS) IMPLANT
TUBE CONNECTING 12'X1/4 (SUCTIONS) ×1
TUBE CONNECTING 12X1/4 (SUCTIONS) ×3 IMPLANT
UNDERPAD 30X30 INCONTINENT (UNDERPADS AND DIAPERS) ×4 IMPLANT
WATER STERILE IRR 1000ML POUR (IV SOLUTION) ×4 IMPLANT
YANKAUER SUCT BULB TIP NO VENT (SUCTIONS) ×4 IMPLANT

## 2015-09-29 NOTE — ED Notes (Signed)
Dr. Clarene Duke aware of pt's BP.

## 2015-09-29 NOTE — Progress Notes (Signed)
Pharmacy Antibiotic Note  Tracy Wade is a 45 y.o. female admitted on 09/29/2015 with open wound.  Pharmacy has been consulted for gentamicin dosing.  Plan: Gentamicin 7 mg/kg q24h     Temp (24hrs), Avg:98.6 F (37 C), Min:98.4 F (36.9 C), Max:98.8 F (37.1 C)   Recent Labs Lab 09/29/15 2158  CREATININE 0.80  LATICACIDVEN 4.55*    CrCl cannot be calculated (Unknown ideal weight.).    Allergies  Allergen Reactions  . Erythromycin Nausea And Vomiting  . Sulfa Antibiotics Rash   Isaac Bliss, PharmD, BCPS, [facility] Clinical Pharmacist Pager [phone] 09/29/2015 10:31 PM

## 2015-09-29 NOTE — Consult Note (Signed)
Reason for Consult: Left elbow fracture/dislocation  Referring Physician: Trauma MD   Tracy Wade is an 45 y.o. female.  HPI: 45 yo RHD female fell from house this pm.  Complains only of left elbow arm pain.  In C-collar for precaution from EMS but no neck pain.  Past Medical History:  Diagnosis Date  . GERD (gastroesophageal reflux disease)   . Hypertension     Past Surgical History:  Procedure Laterality Date  . CESAREAN SECTION     and numerous other gynecological procedures  . MYOMECTOMY     fibroid tumor    Family History  Problem Relation Age of Onset  . Heart failure Father   . Heart attack Father 68  . Stroke Father     Social History:  reports that she has never smoked. She has never used smokeless tobacco. She reports that she drinks alcohol. She reports that she does not use drugs.  Allergies:  Allergies  Allergen Reactions  . Erythromycin Nausea And Vomiting  . Sulfa Antibiotics Rash    Medications:  I have reviewed the patient's current medications. Scheduled: . ondansetron        Results for orders placed or performed during the hospital encounter of 09/29/15 (from the past 24 hour(s))  Prepare fresh frozen plasma     Status: None (Preliminary result)   Collection Time: 09/29/15  9:27 PM  Result Value Ref Range   Unit Number [id number]    Blood Component Type LIQ PLASMA    Unit division 00    Status of Unit ISSUED    Unit tag comment VERBAL ORDERS PER DR LITTLE    Transfusion Status OK TO TRANSFUSE    Unit Number [id number]    Blood Component Type THAWED PLASMA    Unit division 00    Status of Unit ISSUED    Unit tag comment VERBAL ORDERS PER DR LITTLE    Transfusion Status OK TO TRANSFUSE   Type and screen     Status: None (Preliminary result)   Collection Time: 09/29/15  9:27 PM  Result Value Ref Range   ABO/RH(D) PENDING    Antibody Screen PENDING    Sample Expiration 10/02/2015    Unit Number [id number]    Blood  Component Type RED CELLS,LR    Unit division 00    Status of Unit ISSUED    Unit tag comment VERBAL ORDERS PER DR LITTLE    Transfusion Status PENDING    Crossmatch Result PENDING    Unit Number [id number]    Blood Component Type RED CELLS,LR    Unit division 00    Status of Unit ISSUED    Unit tag comment VERBAL ORDERS PER DR LITTLE    Transfusion Status PENDING    Crossmatch Result PENDING   CBC     Status: Abnormal   Collection Time: 09/29/15  9:43 PM  Result Value Ref Range   WBC 16.2 (H) 4.0 - 10.5 K/uL   RBC 4.63 3.87 - 5.11 MIL/uL   Hemoglobin 14.3 12.0 - 15.0 g/dL   HCT 47.4 25.9 - 56.3 %   MCV 91.6 78.0 - 100.0 fL   MCH 30.9 26.0 - 34.0 pg   MCHC 33.7 30.0 - 36.0 g/dL   RDW 87.5 64.3 - 32.9 %   Platelets 244 150 - 400 K/uL  I-Stat Chem 8, ED     Status: Abnormal   Collection Time: 09/29/15  9:58 PM  Result Value Ref Range   Sodium  139 135 - 145 mmol/L   Potassium 3.4 (L) 3.5 - 5.1 mmol/L   Chloride 105 101 - 111 mmol/L   BUN 17 6 - 20 mg/dL   Creatinine, Ser 0.08 0.44 - 1.00 mg/dL   Glucose, Bld 676 (H) 65 - 99 mg/dL   Calcium, Ion 1.95 (L) 1.13 - 1.30 mmol/L   TCO2 19 0 - 100 mmol/L   Hemoglobin 15.0 12.0 - 15.0 g/dL   HCT 09.3 26.7 - 12.4 %  I-Stat CG4 Lactic Acid, ED     Status: Abnormal   Collection Time: 09/29/15  9:58 PM  Result Value Ref Range   Lactic Acid, Venous 4.55 (HH) 0.5 - 1.9 mmol/L   Comment NOTIFIED PHYSICIAN   I-Stat Beta hCG blood, ED ([facility], [facility], [facility] only)     Status: None   Collection Time: 09/29/15 10:07 PM  Result Value Ref Range   I-stat hCG, quantitative <5.0 <5 mIU/mL   Comment 3            X-ray: CLINICAL DATA:  Thrown from horse with open fracture of the left elbow EXAM: LEFT ELBOW - 2 VIEW COMPARISON:  None. FINDINGS: Single limited portable view of the left elbow was performed and reveals evidence of dislocation of the humerus with respect to the ulna and radius proximally as well as a radial neck fracture  with impaction of the radial head at the fracture site. Air is noted within the soft tissues consistent with the open component. No other definitive fractures are seen at this time. Multiple small densities are noted in the surrounding soft tissue consistent with the recent injury. IMPRESSION: Fracture dislocation of the elbow joint as described. Electronically Signed   By: Alcide Clever M.D.  ROS  Healthy female with no recent injuries, illnesses or medical conditions  Blood pressure (!) 172/107, pulse 93, temperature 98.8 F (37.1 C), resp. rate 24, height 5\' 8"  (1.727 m), weight 79.4 kg (175 lb), SpO2 100 %.  Physical Exam  Awake alert oriented, husband by bedside Neck nontender, normal ROM without pain (C-collar removed) Left arm in splint from EMS Open antecubital laceration with exposed bone Diminished radial pulses in ER  Assessment/Plan: Grade II open fracture dislocation of left elbow To OR emergently to I&D left elbow wound and close reduce elbow and splint Will consult one of my upper extremity partners in the am for definitive management in next 48 hours In ER ordered Ancef, Natasha Bence, and PCN plus update in tetanus   Kerstyn Coryell D 09/29/2015, 10:44 PM

## 2015-09-29 NOTE — H&P (Addendum)
History   Tracy Wade is an 45 y.o. female.   Chief Complaint:  Chief Complaint  Patient presents with  . Fall    ELBOW FRACTURE     Pt was seen as a level 1 trauma following a fall from a horse.  She was trying to get her horse to trot and he shied up suddenly.  She fell sideways directly onto arm.  She denied LOC.  She complains of severe pain in her left elbow and had an obvious fracture dislocation with grass in it.  She denies nausea at this point.  She denies shortness of breath.  She was helmeted.  She does not recall striking her head.  She denies abdominal pain.    She was brought directly here by EMS. She was noted to have pulses in her hand on EMS tranport, but upon MD evaluation, she did not have a dopplerable pulse in her wrist.  She was upgraded to a level 1 at that point.      Past Medical History:  Diagnosis Date  . GERD (gastroesophageal reflux disease)   . Hypertension     Past Surgical History:  Procedure Laterality Date  . CESAREAN SECTION     and numerous other gynecological procedures  . MYOMECTOMY     fibroid tumor    Family History  Problem Relation Age of Onset  . Heart failure Father   . Heart attack Father 81  . Stroke Father    Social History:  reports that she has never smoked. She has never used smokeless tobacco. She reports that she drinks alcohol. She reports that she does not use drugs.  Allergies   Allergies  Allergen Reactions  . Erythromycin Nausea And Vomiting  . Sulfa Antibiotics Rash    Home Medications   Medications Prior to Admission  Medication Sig Dispense Refill  . amLODipine (NORVASC) 5 MG tablet Take 5 mg by mouth daily.     Marland Kitchen omeprazole (PRILOSEC) 20 MG capsule Take 20 mg by mouth daily.       Trauma Course   Results for orders placed or performed during the hospital encounter of 09/29/15 (from the past 48 hour(s))  Prepare fresh frozen plasma     Status: None (Preliminary result)   Collection Time:  09/29/15  9:27 PM  Result Value Ref Range   Unit Number [id number]    Blood Component Type LIQ PLASMA    Unit division 00    Status of Unit ISSUED    Unit tag comment VERBAL ORDERS PER DR LITTLE    Transfusion Status OK TO TRANSFUSE    Unit Number [id number]    Blood Component Type THAWED PLASMA    Unit division 00    Status of Unit ISSUED    Unit tag comment VERBAL ORDERS PER DR LITTLE    Transfusion Status OK TO TRANSFUSE   Type and screen     Status: None (Preliminary result)   Collection Time: 09/29/15  9:27 PM  Result Value Ref Range   ABO/RH(D) AB POS    Antibody Screen NEG    Sample Expiration 10/02/2015    Unit Number [id number]    Blood Component Type RED CELLS,LR    Unit division 00    Status of Unit ISSUED    Unit tag comment VERBAL ORDERS PER DR LITTLE    Transfusion Status PENDING    Crossmatch Result PENDING    Unit Number [id number]    Blood Component Type RED CELLS,LR  Unit division 00    Status of Unit ISSUED    Unit tag comment VERBAL ORDERS PER DR LITTLE    Transfusion Status PENDING    Crossmatch Result PENDING   CDS serology     Status: None   Collection Time: 09/29/15  9:43 PM  Result Value Ref Range   CDS serology specimen      SPECIMEN WILL BE HELD FOR 14 DAYS IF TESTING IS REQUIRED  Comprehensive metabolic panel     Status: Abnormal   Collection Time: 09/29/15  9:43 PM  Result Value Ref Range   Sodium 138 135 - 145 mmol/L   Potassium 3.6 3.5 - 5.1 mmol/L   Chloride 107 101 - 111 mmol/L   CO2 20 (L) 22 - 32 mmol/L   Glucose, Bld 145 (H) 65 - 99 mg/dL   BUN 16 6 - 20 mg/dL   Creatinine, Ser 0.96 0.44 - 1.00 mg/dL   Calcium 8.8 (L) 8.9 - 10.3 mg/dL   Total Protein 7.0 6.5 - 8.1 g/dL   Albumin 3.9 3.5 - 5.0 g/dL   AST 29 15 - 41 U/L   ALT 20 14 - 54 U/L   Alkaline Phosphatase 56 38 - 126 U/L   Total Bilirubin 0.6 0.3 - 1.2 mg/dL   GFR calc non Af Amer >60 >60 mL/min   GFR calc Af Amer >60 >60 mL/min    Comment: (NOTE) The  eGFR has been calculated using the CKD EPI equation. This calculation has not been validated in all clinical situations. eGFR's persistently <60 mL/min signify possible Chronic Kidney Disease.    Anion gap 11 5 - 15  CBC     Status: Abnormal   Collection Time: 09/29/15  9:43 PM  Result Value Ref Range   WBC 16.2 (H) 4.0 - 10.5 K/uL   RBC 4.63 3.87 - 5.11 MIL/uL   Hemoglobin 14.3 12.0 - 15.0 g/dL   HCT 42.4 36.0 - 46.0 %   MCV 91.6 78.0 - 100.0 fL   MCH 30.9 26.0 - 34.0 pg   MCHC 33.7 30.0 - 36.0 g/dL   RDW 12.4 11.5 - 15.5 %   Platelets 244 150 - 400 K/uL  Ethanol     Status: None   Collection Time: 09/29/15  9:43 PM  Result Value Ref Range   Alcohol, Ethyl (B) <5 <5 mg/dL    Comment:        LOWEST DETECTABLE LIMIT FOR SERUM ALCOHOL IS 5 mg/dL FOR MEDICAL PURPOSES ONLY   Protime-INR     Status: None   Collection Time: 09/29/15  9:43 PM  Result Value Ref Range   Prothrombin Time 12.8 11.6 - 15.2 seconds   INR 0.94 0.00 - 1.49  ABO/Rh     Status: None   Collection Time: 09/29/15  9:43 PM  Result Value Ref Range   ABO/RH(D) AB POS   I-Stat Chem 8, ED     Status: Abnormal   Collection Time: 09/29/15  9:58 PM  Result Value Ref Range   Sodium 139 135 - 145 mmol/L   Potassium 3.4 (L) 3.5 - 5.1 mmol/L   Chloride 105 101 - 111 mmol/L   BUN 17 6 - 20 mg/dL   Creatinine, Ser 0.80 0.44 - 1.00 mg/dL   Glucose, Bld 149 (H) 65 - 99 mg/dL   Calcium, Ion 0.88 (L) 1.13 - 1.30 mmol/L   TCO2 19 0 - 100 mmol/L   Hemoglobin 15.0 12.0 - 15.0 g/dL   HCT  44.0 36.0 - 46.0 %  I-Stat CG4 Lactic Acid, ED     Status: Abnormal   Collection Time: 09/29/15  9:58 PM  Result Value Ref Range   Lactic Acid, Venous 4.55 (HH) 0.5 - 1.9 mmol/L   Comment NOTIFIED PHYSICIAN   I-Stat Beta hCG blood, ED ([facility], [facility], [facility] only)     Status: None   Collection Time: 09/29/15 10:07 PM  Result Value Ref Range   I-stat hCG, quantitative <5.0 <5 mIU/mL   Comment 3            Comment:   GEST. AGE      CONC.   (mIU/mL)   <=1 WEEK        5 - 50     2 WEEKS       50 - 500     3 WEEKS       100 - 10,000     4 WEEKS     1,000 - 30,000        FEMALE AND NON-PREGNANT FEMALE:     LESS THAN 5 mIU/mL    Dg Elbow 2 Views Left  Result Date: 09/29/2015 CLINICAL DATA:  Thrown from horse with open fracture of the left elbow EXAM: LEFT ELBOW - 2 VIEW COMPARISON:  None. FINDINGS: Single limited portable view of the left elbow was performed and reveals evidence of dislocation of the humerus with respect to the ulna and radius proximally as well as a radial neck fracture with impaction of the radial head at the fracture site. Air is noted within the soft tissues consistent with the open component. No other definitive fractures are seen at this time. Multiple small densities are noted in the surrounding soft tissue consistent with the recent injury. IMPRESSION: Fracture dislocation of the elbow joint as described. Electronically Signed   By: Inez Catalina M.D.   On: 09/29/2015 22:17  Ct Head Wo Contrast  Result Date: 09/29/2015 CLINICAL DATA:  Post fall from horse EXAM: CT HEAD WITHOUT CONTRAST CT CERVICAL SPINE WITHOUT CONTRAST TECHNIQUE: Multidetector CT imaging of the head and cervical spine was performed following the standard protocol without intravenous contrast. Multiplanar CT image reconstructions of the cervical spine were also generated. COMPARISON:  None. FINDINGS: CT HEAD FINDINGS Regional soft tissues appear normal. No displaced calvarial fracture. No radiopaque foreign body. Gray-white differentiation is maintained. No CT evidence acute large territory infarct. No intraparenchymal or extra-axial mass or hemorrhage. Normal size and configuration of the ventricles and basilar cisterns. No midline shift. Limited visualization the paranasal sinuses and mastoid air cells is normal. No air-fluid levels. CT CERVICAL SPINE FINDINGS C1 to the superior endplate of T1 is imaged. Normal alignment of the cervical spine. No  anterolisthesis or retrolisthesis. The dens is normally positioned between the lateral masses of C1. Normal atlantodental and atlantoaxial articulations. The bilateral facets are normally aligned. No fracture or static subluxation of the cervical spine. Cervical vertebral body heights are preserved. Prevertebral soft tissues are normal. 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 Regional soft tissues appear normal. No bulky cervical lymphadenopathy on this noncontrast examination. Normal noncontrast appearance of the thyroid gland. Limited visualization of the lung apices is normal. IMPRESSION: 1. Negative noncontrast head CT. 2. No fracture or static subluxation of the cervical spine. 3. 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 Critical Value/emergent results were discussed in person at the time of interpretation on 09/29/2015 at 10:28 pm to Dr. Barry Dienes, who verbally acknowledged these results. Electronically Signed   By: Sandi Mariscal M.D.   On: 09/29/2015 22:29  Ct Cervical Spine Wo Contrast  Result Date: 09/29/2015 CLINICAL DATA:  Post fall from horse EXAM: CT HEAD WITHOUT CONTRAST CT CERVICAL SPINE WITHOUT CONTRAST TECHNIQUE: Multidetector CT imaging of the head and cervical spine was performed following the standard protocol without intravenous contrast. Multiplanar CT image reconstructions of the cervical spine were also generated. COMPARISON:  None. FINDINGS: CT HEAD FINDINGS Regional soft tissues appear normal. No displaced calvarial fracture. No radiopaque foreign body. Gray-white differentiation is maintained. No CT evidence acute large territory infarct. No intraparenchymal or extra-axial mass or hemorrhage. Normal size and configuration of the ventricles  and basilar cisterns. No midline shift. Limited visualization the paranasal sinuses and mastoid air cells is normal. No air-fluid levels. CT CERVICAL SPINE FINDINGS C1 to the superior endplate of T1 is imaged. Normal alignment of the cervical spine. No anterolisthesis or retrolisthesis. The dens is normally positioned between the lateral masses of C1. Normal atlantodental and atlantoaxial articulations. The bilateral facets are normally aligned. No fracture or static subluxation of the cervical spine. Cervical vertebral body heights are preserved. Prevertebral soft tissues are normal. 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 Regional soft tissues appear normal. No bulky cervical lymphadenopathy on this noncontrast examination. Normal noncontrast appearance of the thyroid gland. Limited visualization of the lung apices is normal. IMPRESSION: 1. Negative noncontrast head CT. 2. No fracture or static subluxation of the cervical spine. 3. 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 Critical Value/emergent results were discussed in person at the time of interpretation on 09/29/2015 at 10:28 pm to Dr. Barry Dienes, who verbally acknowledged these results. Electronically Signed   By: Sandi Mariscal M.D.   On: 09/29/2015 22:29  Dg Pelvis Portable  Result Date: 09/29/2015 CLINICAL DATA:  45 year old female with level 1 trauma. Patient fell off a horse. EXAM: PORTABLE PELVIS 1-2 VIEWS COMPARISON:  None. FINDINGS: There is no evidence of pelvic fracture or diastasis. No pelvic bone lesions are seen. IMPRESSION: Negative. Electronically Signed   By: Anner Crete M.D.   On: 09/29/2015 22:16  Dg Chest Port 1 View  Result Date: 09/29/2015 CLINICAL DATA:  Thrown from horse with known  elbow fracture, initial encounter EXAM: PORTABLE CHEST 1 VIEW COMPARISON:  01/16/2013 FINDINGS: The heart size and mediastinal contours are within normal limits. Both lungs are clear. The visualized skeletal structures are unremarkable. IMPRESSION: No acute abnormality noted. Electronically Signed   By: Inez Catalina M.D.   On: 09/29/2015 22:16   Review of Systems  Constitutional: Negative.   HENT: Negative.   Eyes: Negative.   Respiratory: Negative.   Cardiovascular: Negative.   Gastrointestinal: Negative.   Genitourinary: Negative.   Musculoskeletal: Positive for joint pain (severe left elbow pain).  Skin: Negative.   Neurological: Negative.   Endo/Heme/Allergies: Negative.   Psychiatric/Behavioral: Negative.     Blood pressure (!) 172/107, pulse 93, temperature 98.8 F (37.1 C), resp. rate 24, height 5' 8" (1.727 m), weight 79.4 kg (175 lb), SpO2 100 %. Physical Exam  Constitutional: She is oriented to person, place,  and time. She appears well-developed and well-nourished. She appears distressed. Cervical collar and backboard in place.  HENT:  Head: Normocephalic and atraumatic.  Right Ear: External ear normal.  Left Ear: External ear normal.  Mouth/Throat: Oropharynx is clear and moist.  Eyes: Conjunctivae are normal. Pupils are equal, round, and reactive to light. Right eye exhibits no discharge. Left eye exhibits no discharge. No scleral icterus.  Neck: Neck supple. No tracheal deviation present. No thyromegaly present.  Cardiovascular: Normal rate, regular rhythm and normal heart sounds.   Left radial pulse absent by doppler/palpation.   Monophasic ulnar signal by doppler.  Not palpable.    Respiratory: Effort normal and breath sounds normal. No respiratory distress. She exhibits no tenderness.  GI: Soft. She exhibits no distension. There is no tenderness.  Pelvis stable.  Musculoskeletal: She exhibits edema, tenderness and deformity.  Open fracture dislocation of the left  elbow.  Exposed articular surface of the humerus with grass in wound  Lymphadenopathy:    She has no cervical adenopathy.  Neurological: She is alert and oriented to person, place, and time. No cranial nerve deficit. Coordination normal.  Skin: Skin is warm and dry. No rash noted. She is not diaphoretic. No erythema. No pallor.  Psychiatric: She has a normal mood and affect. Her behavior is normal. Judgment and thought content normal.     Assessment/Plan Level 1 trauma Fall from horse. Open fracture dislocation of the left elbow with possible vascular injury.  Dr. Alvan Dame to take to OR for washout and splinting. Dr. Caralyn Guile with tentative plan for definitive surgery Wednesday.    Admit for pain control and observation/pulse checks.    Wannetta Langland 09/29/2015, 11:27 PM   Procedures

## 2015-09-29 NOTE — Anesthesia Preprocedure Evaluation (Signed)
Anesthesia Evaluation  Patient identified by MRN, date of birth, ID band Patient awake  General Assessment Comment:Dr. Charlann Boxer states that C-Spine cleared. He examined patient. CE  Reviewed: Allergy & Precautions, NPO status , reviewed documented beta blocker date and time   Airway Mallampati: II  TM Distance: >3 FB Neck ROM: Full    Dental   Pulmonary neg pulmonary ROS,    breath sounds clear to auscultation       Cardiovascular hypertension,  Rhythm:Regular Rate:Normal     Neuro/Psych    GI/Hepatic Neg liver ROS, GERD  ,  Endo/Other  negative endocrine ROS  Renal/GU negative Renal ROS     Musculoskeletal   Abdominal   Peds  Hematology   Anesthesia Other Findings   Reproductive/Obstetrics                             Anesthesia Physical Anesthesia Plan  ASA: III  Anesthesia Plan:    Post-op Pain Management:    Induction: Intravenous, Rapid sequence and Cricoid pressure planned  Airway Management Planned: Oral ETT  Additional Equipment:   Intra-op Plan:   Post-operative Plan: Extubation in OR  Informed Consent: I have reviewed the patients History and Physical, chart, labs and discussed the procedure including the risks, benefits and alternatives for the proposed anesthesia with the patient or authorized representative who has indicated his/her understanding and acceptance.     Plan Discussed with: CRNA, Anesthesiologist and Surgeon  Anesthesia Plan Comments:         Anesthesia Quick Evaluation

## 2015-09-29 NOTE — Anesthesia Procedure Notes (Signed)
Procedure Name: Intubation Date/Time: 09/29/2015 11:38 PM Performed by: Julianne Rice Z Pre-anesthesia Checklist: Patient identified, Emergency Drugs available, Suction available, Patient being monitored and Timeout performed Patient Re-evaluated:Patient Re-evaluated prior to inductionOxygen Delivery Method: Circle system utilized Preoxygenation: Pre-oxygenation with 100% oxygen Intubation Type: IV induction, Rapid sequence and Cricoid Pressure applied Laryngoscope Size: Mac, 3, Miller and 2 Grade View: Grade III Tube type: Oral Tube size: 7.5 mm Number of attempts: 2 Airway Equipment and Method: Stylet Placement Confirmation: ETT inserted through vocal cords under direct vision Secured at: 23 cm Tube secured with: Tape Dental Injury: Teeth and Oropharynx as per pre-operative assessment  Difficulty Due To: Difficulty was unanticipated and Difficult Airway- due to immobile epiglottis Future Recommendations: Recommend- induction with short-acting agent, and alternative techniques readily available Comments: MAC 3 utilized on 1st attempt; unable to lift epiglottis Intubated by Dr. Randa Evens intubated with Hyacinth Meeker 2

## 2015-09-29 NOTE — ED Provider Notes (Signed)
[facility]-EMERGENCY DEPT Provider Note   CSN: [id number] Arrival date & time: 09/29/15  2121  First Provider Contact:  First MD Initiated Contact with Patient 09/29/15 2146      LEVEL 5 CAVEAT DUE TO ACUITY OF CONDITION   History   Chief Complaint Chief Complaint  Patient presents with  . Fall    ELBOW FRACTURE     HPI Tracy Wade is a 45 y.o. female.  The history is provided by the patient and the EMS personnel. The history is limited by the condition of the patient.  Fall  This is a new problem. Episode onset: JUST PRIOR TO ARRIVAL. The problem occurs constantly. The problem has been rapidly worsening. Pertinent negatives include no abdominal pain and no headaches. Exacerbated by: MOVEMENT. The symptoms are relieved by rest.  PATIENT PRESENTS S/P FALL FROM HORSE SHE LANDED ON LEFT ELBOW AND NOW HAS SIGNIFICANT INJURY TO THAT ARM NO OTHER INJURIES REPORTED   Past Medical History:  Diagnosis Date  . GERD (gastroesophageal reflux disease)   . Hypertension     Patient Active Problem List   Diagnosis Date Noted  . Stress at work 05/30/2011  . Anxiety with somatization 05/30/2011  . Nonspecific abnormal electrocardiogram (ECG) (EKG) 05/30/2011  . White coat hypertension 05/30/2011    Past Surgical History:  Procedure Laterality Date  . CESAREAN SECTION     and numerous other gynecological procedures  . MYOMECTOMY     fibroid tumor    OB History    No data available       Home Medications    Prior to Admission medications   Medication Sig Start Date End Date Taking? Authorizing Provider  amLODipine (NORVASC) 5 MG tablet Take 5 mg by mouth daily.  12/29/12   Historical Provider, MD  omeprazole (PRILOSEC) 20 MG capsule Take 20 mg by mouth daily.  01/14/13   Historical Provider, MD    Family History Family History  Problem Relation Age of Onset  . Heart failure Father   . Heart attack Father 82  . Stroke Father     Social History Social History    Substance Use Topics  . Smoking status: Never Smoker  . Smokeless tobacco: Never Used  . Alcohol use Yes     Comment: rare glass of wine     Allergies   Erythromycin and Sulfa antibiotics   Review of Systems Review of Systems  Unable to perform ROS: Acuity of condition  Gastrointestinal: Negative for abdominal pain.  Neurological: Negative for headaches.     Physical Exam Updated Vital Signs BP 140/92   Pulse 89   Temp 98.8 F (37.1 C)   Resp 18   SpO2 100%   Physical Exam CONSTITUTIONAL: anxious, agitated HEAD: Normocephalic/atraumatic EYES: EOMI ENMT: Mucous membranes moist, No evidence of facial/nasal trauma SPINE/BACK:entire spine nontender, Patient maintained in spinal precautions/logroll utilized, No bruising/crepitance/stepoffs noted to spine CV: S1/S2 noted LUNGS: Lungs are clear to auscultation bilaterally, no apparent distress ABDOMEN: soft, nontender Pelvis - stable  NEURO: Pt is awake/alert/appropriate, GCS 15 EXTREMITIES: open wound to left elbow.  Distal pulses found by doppler on left ulnar pulse.  See photo SKIN: warm, color normal PSYCH: anxious     ED Treatments / Results  Labs (all labs ordered are listed, but only abnormal results are displayed) Labs Reviewed  CBC - Abnormal; Notable for the following:       Result Value   WBC 16.2 (*)    All other components within  normal limits  I-STAT CHEM 8, ED - Abnormal; Notable for the following:    Potassium 3.4 (*)    Glucose, Bld 149 (*)    Calcium, Ion 0.88 (*)    All other components within normal limits  I-STAT CG4 LACTIC ACID, ED - Abnormal; Notable for the following:    Lactic Acid, Venous 4.55 (*)    All other components within normal limits  PROTIME-INR  CDS SEROLOGY  COMPREHENSIVE METABOLIC PANEL  ETHANOL  URINALYSIS, ROUTINE W REFLEX MICROSCOPIC (NOT AT [facility])  I-STAT BETA HCG BLOOD, ED ([facility], [facility], [facility] ONLY)  PREPARE FRESH FROZEN PLASMA  TYPE AND SCREEN    EKG  EKG  Interpretation None       Radiology Dg Elbow 2 Views Left  Result Date: 09/29/2015 CLINICAL DATA:  Thrown from horse with open fracture of the left elbow EXAM: LEFT ELBOW - 2 VIEW COMPARISON:  None. FINDINGS: Single limited portable view of the left elbow was performed and reveals evidence of dislocation of the humerus with respect to the ulna and radius proximally as well as a radial neck fracture with impaction of the radial head at the fracture site. Air is noted within the soft tissues consistent with the open component. No other definitive fractures are seen at this time. Multiple small densities are noted in the surrounding soft tissue consistent with the recent injury. IMPRESSION: Fracture dislocation of the elbow joint as described. Electronically Signed   By: Alcide Clever M.D.   On: 09/29/2015 22:17  Ct Head Wo Contrast  Result Date: 09/29/2015 CLINICAL DATA:  Post fall from horse EXAM: CT HEAD WITHOUT CONTRAST CT CERVICAL SPINE WITHOUT CONTRAST TECHNIQUE: Multidetector CT imaging of the head and cervical spine was performed following the standard protocol without intravenous contrast. Multiplanar CT image reconstructions of the cervical spine were also generated. COMPARISON:  None. FINDINGS: CT HEAD FINDINGS Regional soft tissues appear normal. No displaced calvarial fracture. No radiopaque foreign body. Gray-white differentiation is maintained. No CT evidence acute large territory infarct. No intraparenchymal or extra-axial mass or hemorrhage. Normal size and configuration of the ventricles and basilar cisterns. No midline shift. Limited visualization the paranasal sinuses and mastoid air cells is normal. No air-fluid levels. CT CERVICAL SPINE FINDINGS C1 to the superior endplate of T1 is imaged. Normal alignment of the cervical spine. No anterolisthesis or retrolisthesis. The dens is normally positioned between the lateral masses of C1. Normal atlantodental and atlantoaxial articulations.  The bilateral facets are normally aligned. No fracture or static subluxation of the cervical spine. Cervical vertebral body heights are preserved. Prevertebral soft tissues are normal. 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 Regional soft tissues appear normal. No bulky cervical lymphadenopathy on this noncontrast examination. Normal noncontrast appearance of the thyroid gland. Limited visualization of the lung apices is normal. IMPRESSION: 1. Negative noncontrast head CT. 2. No fracture or static subluxation of the cervical spine. 3. 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 Critical Value/emergent results were discussed in person at the time of interpretation on 09/29/2015 at 10:28 pm to Dr. Donell Beers, who verbally acknowledged these results. Electronically Signed   By: Simonne Come M.D.   On: 09/29/2015 22:29  Ct Cervical Spine Wo Contrast  Result Date: 09/29/2015 CLINICAL DATA:  Post fall from horse EXAM: CT  HEAD WITHOUT CONTRAST CT CERVICAL SPINE WITHOUT CONTRAST TECHNIQUE: Multidetector CT imaging of the head and cervical spine was performed following the standard protocol without intravenous contrast. Multiplanar CT image reconstructions of the cervical spine were also generated. COMPARISON:  None. FINDINGS: CT HEAD FINDINGS Regional soft tissues appear normal. No displaced calvarial fracture. No radiopaque foreign body. Gray-white differentiation is maintained. No CT evidence acute large territory infarct. No intraparenchymal or extra-axial mass or hemorrhage. Normal size and configuration of the ventricles and basilar cisterns. No midline shift. Limited visualization the paranasal sinuses and mastoid air cells is normal. No air-fluid levels. CT CERVICAL SPINE  FINDINGS C1 to the superior endplate of T1 is imaged. Normal alignment of the cervical spine. No anterolisthesis or retrolisthesis. The dens is normally positioned between the lateral masses of C1. Normal atlantodental and atlantoaxial articulations. The bilateral facets are normally aligned. No fracture or static subluxation of the cervical spine. Cervical vertebral body heights are preserved. Prevertebral soft tissues are normal. 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 Regional soft tissues appear normal. No bulky cervical lymphadenopathy on this noncontrast examination. Normal noncontrast appearance of the thyroid gland. Limited visualization of the lung apices is normal. IMPRESSION: 1. Negative noncontrast head CT. 2. No fracture or static subluxation of the cervical spine. 3. 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 Critical Value/emergent results were discussed in person at the time of interpretation on 09/29/2015 at 10:28 pm to Dr. Donell Beers, who verbally acknowledged these results. Electronically Signed   By: Simonne Come M.D.   On: 09/29/2015 22:29  Dg Pelvis Portable  Result Date: 09/29/2015 CLINICAL DATA:  45 year old female with level 1 trauma. Patient fell off a horse. EXAM: PORTABLE PELVIS 1-2 VIEWS COMPARISON:  None. FINDINGS: There is no evidence of pelvic fracture or diastasis. No pelvic bone lesions are seen. IMPRESSION: Negative. Electronically Signed   By: Elgie Collard M.D.   On: 09/29/2015 22:16  Dg Chest Port 1 View  Result Date: 09/29/2015 CLINICAL DATA:  Thrown from horse with known elbow fracture, initial encounter EXAM: PORTABLE CHEST 1 VIEW COMPARISON:  01/16/2013 FINDINGS: The heart size and mediastinal contours are within normal  limits. Both lungs are clear. The visualized skeletal structures are unremarkable. IMPRESSION: No acute abnormality noted. Electronically Signed   By: Alcide Clever M.D.   On: 09/29/2015 22:16   Procedures Procedures  CRITICAL CARE Performed by: Joya Gaskins Total critical care time: 31 minutes Critical care time was exclusive of separately billable procedures and treating other patients. Critical care was necessary to treat or prevent imminent or life-threatening deterioration. Critical care was time spent personally by me on the following activities: development of treatment plan with patient and/or surrogate as well as nursing, discussions with consultants, evaluation of patient's response to treatment, examination of patient, obtaining history from patient or surrogate, ordering and performing treatments and interventions, ordering and review of laboratory studies, ordering and review of radiographic studies, pulse oximetry and re-evaluation of patient's condition.   Medications Ordered in ED Medications  ondansetron (ZOFRAN) 4 MG/2ML injection (not administered)  ceFAZolin (ANCEF) IVPB 2g/100 mL premix (2 g Intravenous New Bag/Given 09/29/15 2225)  gentamicin (GARAMYCIN) 460 mg in dextrose 5 % 100 mL IVPB (not administered)  HYDROmorphone (DILAUDID) 1 MG/ML injection (2 mg  Given 09/29/15 2218)  Tdap (BOOSTRIX) injection 0.5 mL (0.5 mLs Intramuscular Given 09/29/15 2226)  HYDROmorphone (DILAUDID) 1 MG/ML injection (  Given 09/29/15 2218)  HYDROmorphone (DILAUDID) injection (1 mg Intravenous Given 09/29/15 2134)  HYDROmorphone (DILAUDID) injection (2 mg Intravenous Given 09/29/15 2156)  HYDROmorphone (DILAUDID) 1 MG/ML injection (  Given 09/29/15 2219)  0.9 %  sodium chloride infusion (1,000 mLs Intravenous New Bag/Given 09/29/15 2231)     Initial Impression / Assessment and Plan / ED Course  I have reviewed the triage vital signs and the nursing notes.  Pertinent labs & imaging  results that were available during my care of the patient were reviewed by me and considered in my medical decision making (see chart for details).  Clinical Course    Pt in the ED after fall from horse Her only obvious injury was open fx to left elbow After d/w trauma surgery dr Rowan Blase, plan to obtain CT head/cspine as unable to clear head or neck injury No obvious chest or abdominal trauma D/w dr Charlann Boxer with ortho We discussed case He recommends ancef and gentamycin   Seen in ED by dr Charlann Boxer He will take to OR for washout  Final Clinical Impressions(s) / ED Diagnoses   Final diagnoses:  Left elbow fracture, open, initial encounter    New Prescriptions New Prescriptions   No medications on file     Zadie Rhine, MD 09/29/15 2251

## 2015-09-30 ENCOUNTER — Encounter (HOSPITAL_COMMUNITY): Payer: Self-pay | Admitting: *Deleted

## 2015-09-30 ENCOUNTER — Inpatient Hospital Stay (HOSPITAL_COMMUNITY): Payer: BC Managed Care – PPO

## 2015-09-30 DIAGNOSIS — S45192A Other specified injury of brachial artery, left side, initial encounter: Secondary | ICD-10-CM

## 2015-09-30 DIAGNOSIS — S45102A Unspecified injury of brachial artery, left side, initial encounter: Secondary | ICD-10-CM | POA: Diagnosis present

## 2015-09-30 LAB — PREPARE FRESH FROZEN PLASMA
Unit division: 0
Unit division: 0

## 2015-09-30 LAB — TYPE AND SCREEN
ABO/RH(D): AB POS
Antibody Screen: NEGATIVE
Unit division: 0
Unit division: 0

## 2015-09-30 LAB — URINALYSIS, ROUTINE W REFLEX MICROSCOPIC
Bilirubin Urine: NEGATIVE
Glucose, UA: 500 mg/dL — AB
Ketones, ur: NEGATIVE mg/dL
Leukocytes, UA: NEGATIVE
Nitrite: NEGATIVE
Protein, ur: NEGATIVE mg/dL
Specific Gravity, Urine: 1.018 (ref 1.005–1.030)
pH: 6 (ref 5.0–8.0)

## 2015-09-30 LAB — URINE MICROSCOPIC-ADD ON

## 2015-09-30 LAB — CBC
HEMATOCRIT: 37.6 % (ref 36.0–46.0)
HEMOGLOBIN: 12.6 g/dL (ref 12.0–15.0)
MCH: 30.4 pg (ref 26.0–34.0)
MCHC: 33.5 g/dL (ref 30.0–36.0)
MCV: 90.8 fL (ref 78.0–100.0)
Platelets: 219 10*3/uL (ref 150–400)
RBC: 4.14 MIL/uL (ref 3.87–5.11)
RDW: 12.5 % (ref 11.5–15.5)
WBC: 10.7 10*3/uL — ABNORMAL HIGH (ref 4.0–10.5)

## 2015-09-30 LAB — CREATININE, SERUM
CREATININE: 0.83 mg/dL (ref 0.44–1.00)
GFR calc Af Amer: >60 mL/min (ref >60)
GFR calc non Af Amer: >60 mL/min (ref >60)

## 2015-09-30 LAB — BLOOD PRODUCT ORDER (VERBAL) VERIFICATION

## 2015-09-30 MED ORDER — DOCUSATE SODIUM 100 MG PO CAPS
100.0000 mg | ORAL_CAPSULE | Freq: Two times a day (BID) | ORAL | Status: DC
  Administered 2015-09-30 – 2015-10-07 (×11): 100 mg via ORAL
  Filled 2015-09-30 (×16): qty 1

## 2015-09-30 MED ORDER — HEPARIN SODIUM (PORCINE) 5000 UNIT/ML IJ SOLN
INTRAMUSCULAR | Status: DC | PRN
  Administered 2015-09-30: 500 mL

## 2015-09-30 MED ORDER — ALPRAZOLAM 0.5 MG PO TABS
0.5000 mg | ORAL_TABLET | Freq: Three times a day (TID) | ORAL | Status: DC | PRN
  Administered 2015-09-30 – 2015-10-06 (×9): 0.5 mg via ORAL
  Filled 2015-09-30 (×11): qty 1

## 2015-09-30 MED ORDER — PENICILLIN G POTASSIUM 5000000 UNITS IJ SOLR
2.0000 10*6.[IU] | INTRAMUSCULAR | Status: AC
  Administered 2015-09-30: 2 10*6.[IU] via INTRAVENOUS
  Filled 2015-09-30: qty 2

## 2015-09-30 MED ORDER — KCL IN DEXTROSE-NACL 20-5-0.45 MEQ/L-%-% IV SOLN
INTRAVENOUS | Status: DC
  Administered 2015-09-30: 05:00:00 via INTRAVENOUS
  Filled 2015-09-30 (×2): qty 1000

## 2015-09-30 MED ORDER — POLYETHYLENE GLYCOL 3350 17 G PO PACK
17.0000 g | PACK | Freq: Every day | ORAL | Status: DC
  Administered 2015-09-30 – 2015-10-06 (×4): 17 g via ORAL
  Filled 2015-09-30 (×7): qty 1

## 2015-09-30 MED ORDER — ONDANSETRON HCL 4 MG/2ML IJ SOLN
INTRAMUSCULAR | Status: AC
  Filled 2015-09-30: qty 2

## 2015-09-30 MED ORDER — KCL IN DEXTROSE-NACL 20-5-0.45 MEQ/L-%-% IV SOLN
INTRAVENOUS | Status: AC
  Filled 2015-09-30: qty 1000

## 2015-09-30 MED ORDER — DIPHENHYDRAMINE HCL 50 MG/ML IJ SOLN: 12.5000 mg | Freq: Four times a day (QID) | INTRAMUSCULAR | Status: DC | PRN

## 2015-09-30 MED ORDER — HYDROMORPHONE HCL 1 MG/ML IJ SOLN: 0.5000 mg | INTRAMUSCULAR | Status: DC | PRN

## 2015-09-30 MED ORDER — NALOXONE HCL 0.4 MG/ML IJ SOLN: 0.4000 mg | INTRAMUSCULAR | Status: DC | PRN

## 2015-09-30 MED ORDER — OXYCODONE HCL 5 MG PO TABS: 5.0000 mg | ORAL_TABLET | ORAL | Status: DC | PRN

## 2015-09-30 MED ORDER — OXYCODONE HCL 5 MG PO TABS
ORAL_TABLET | ORAL | Status: AC
  Filled 2015-09-30: qty 2

## 2015-09-30 MED ORDER — HYDROMORPHONE 1 MG/ML IV SOLN
INTRAVENOUS | Status: DC
  Administered 2015-09-30: 1 mg via INTRAVENOUS
  Administered 2015-09-30: 2.9 mg via INTRAVENOUS
  Administered 2015-09-30: 1.5 mg via INTRAVENOUS
  Administered 2015-10-01: 2.4 mg via INTRAVENOUS
  Administered 2015-10-01: 22:00:00 via INTRAVENOUS
  Administered 2015-10-01: 3 mg via INTRAVENOUS
  Administered 2015-10-01: 0.6 mg via INTRAVENOUS
  Administered 2015-10-02: 10 mg via INTRAVENOUS
  Administered 2015-10-02 (×2): 6 mg via INTRAVENOUS
  Administered 2015-10-02: 3.6 mg via INTRAVENOUS
  Administered 2015-10-02: 2.7 mg via INTRAVENOUS
  Administered 2015-10-03: 21:00:00 via INTRAVENOUS
  Administered 2015-10-03: 1.8 mg via INTRAVENOUS
  Administered 2015-10-03 (×2): 0.9 mg via INTRAVENOUS
  Administered 2015-10-03: 1.2 mg via INTRAVENOUS
  Administered 2015-10-03: 1.5 mg via INTRAVENOUS
  Administered 2015-10-04 (×4): 0.9 mg via INTRAVENOUS
  Administered 2015-10-04: 0.6 mg via INTRAVENOUS
  Filled 2015-09-30 (×3): qty 25

## 2015-09-30 MED ORDER — HYDROMORPHONE HCL 1 MG/ML IJ SOLN
INTRAMUSCULAR | Status: AC
  Filled 2015-09-30: qty 1

## 2015-09-30 MED ORDER — OXYCODONE HCL 5 MG PO TABS
5.0000 mg | ORAL_TABLET | ORAL | Status: DC | PRN
  Administered 2015-09-30 (×2): 10 mg via ORAL
  Filled 2015-09-30: qty 2

## 2015-09-30 MED ORDER — PANTOPRAZOLE SODIUM 40 MG PO TBEC
40.0000 mg | DELAYED_RELEASE_TABLET | Freq: Every day | ORAL | Status: DC
  Administered 2015-09-30 – 2015-10-07 (×7): 40 mg via ORAL
  Filled 2015-09-30 (×9): qty 1

## 2015-09-30 MED ORDER — ENOXAPARIN SODIUM 40 MG/0.4ML ~~LOC~~ SOLN
40.0000 mg | SUBCUTANEOUS | Status: DC
  Administered 2015-09-30 – 2015-10-06 (×5): 40 mg via SUBCUTANEOUS
  Filled 2015-09-30 (×6): qty 0.4

## 2015-09-30 MED ORDER — HEPARIN SODIUM (PORCINE) 1000 UNIT/ML IJ SOLN
INTRAMUSCULAR | Status: DC | PRN
  Administered 2015-09-30: 6000 [IU] via INTRAVENOUS

## 2015-09-30 MED ORDER — PROMETHAZINE HCL 25 MG/ML IJ SOLN: 6.2500 mg | INTRAMUSCULAR | Status: DC | PRN

## 2015-09-30 MED ORDER — ONDANSETRON HCL 4 MG PO TABS: 4.0000 mg | ORAL_TABLET | Freq: Four times a day (QID) | ORAL | Status: DC | PRN

## 2015-09-30 MED ORDER — PENICILLIN G POTASSIUM 5000000 UNITS IJ SOLR
2.0000 10*6.[IU] | INTRAMUSCULAR | Status: DC
  Administered 2015-09-30 – 2015-10-07 (×36): 2 10*6.[IU] via INTRAVENOUS
  Filled 2015-09-30 (×47): qty 2

## 2015-09-30 MED ORDER — ONDANSETRON HCL 4 MG/2ML IJ SOLN
4.0000 mg | Freq: Four times a day (QID) | INTRAMUSCULAR | Status: DC | PRN
  Administered 2015-09-30 – 2015-10-05 (×3): 4 mg via INTRAVENOUS
  Filled 2015-09-30 (×3): qty 2

## 2015-09-30 MED ORDER — CEFAZOLIN SODIUM-DEXTROSE 2-4 GM/100ML-% IV SOLN
2.0000 g | INTRAVENOUS | Status: AC
  Administered 2015-10-01: 2 g via INTRAVENOUS
  Filled 2015-09-30 (×2): qty 100

## 2015-09-30 MED ORDER — CHLORHEXIDINE GLUCONATE 4 % EX LIQD
60.0000 mL | Freq: Once | CUTANEOUS | Status: AC
  Administered 2015-10-01: 4 via TOPICAL
  Filled 2015-09-30: qty 60

## 2015-09-30 MED ORDER — DIPHENHYDRAMINE HCL 12.5 MG/5ML PO ELIX
12.5000 mg | ORAL_SOLUTION | Freq: Four times a day (QID) | ORAL | Status: DC | PRN
  Administered 2015-10-02: 12.5 mg via ORAL
  Filled 2015-09-30 (×2): qty 10

## 2015-09-30 MED ORDER — HYDROMORPHONE HCL 1 MG/ML IJ SOLN
1.0000 mg | INTRAMUSCULAR | Status: DC | PRN
  Administered 2015-09-30 (×3): 1 mg via INTRAVENOUS
  Filled 2015-09-30 (×3): qty 1

## 2015-09-30 MED ORDER — CEFAZOLIN IN D5W 1 GM/50ML IV SOLN
1.0000 g | Freq: Three times a day (TID) | INTRAVENOUS | Status: DC
  Administered 2015-09-30 – 2015-10-07 (×18): 1 g via INTRAVENOUS
  Filled 2015-09-30 (×25): qty 50

## 2015-09-30 MED ORDER — PANTOPRAZOLE SODIUM 40 MG PO TBEC: 40.0000 mg | DELAYED_RELEASE_TABLET | Freq: Every day | ORAL | Status: DC

## 2015-09-30 MED ORDER — HYDROMORPHONE HCL 1 MG/ML IJ SOLN
0.2500 mg | INTRAMUSCULAR | Status: DC | PRN
  Administered 2015-09-30 (×4): 0.5 mg via INTRAVENOUS

## 2015-09-30 MED ORDER — SODIUM CHLORIDE 0.9 % IV SOLN
INTRAVENOUS | Status: DC
  Administered 2015-09-30: 12:00:00 via INTRAVENOUS
  Administered 2015-10-01 – 2015-10-02 (×2): 1 mL via INTRAVENOUS
  Administered 2015-10-03: 19:00:00 via INTRAVENOUS

## 2015-09-30 MED ORDER — HYDROMORPHONE HCL 1 MG/ML IJ SOLN
0.2500 mg | INTRAMUSCULAR | Status: DC | PRN
  Administered 2015-09-30: 0.5 mg via INTRAVENOUS

## 2015-09-30 MED ORDER — SODIUM CHLORIDE 0.9% FLUSH: 9.0000 mL | INTRAVENOUS | Status: DC | PRN

## 2015-09-30 MED ORDER — AMLODIPINE BESYLATE 5 MG PO TABS
5.0000 mg | ORAL_TABLET | Freq: Every day | ORAL | Status: DC
  Administered 2015-09-30 – 2015-10-07 (×7): 5 mg via ORAL
  Filled 2015-09-30 (×9): qty 1

## 2015-09-30 MED ORDER — ACETAMINOPHEN 325 MG PO TABS
650.0000 mg | ORAL_TABLET | ORAL | Status: DC | PRN
  Administered 2015-09-30 – 2015-10-03 (×4): 650 mg via ORAL
  Filled 2015-09-30 (×4): qty 2

## 2015-09-30 NOTE — Op Note (Signed)
NAMEALEXIS, Tracy Wade NO.:  [id number]  MEDICAL RECORD NO.:  [id number]  LOCATION:  [id number]                        FACILITY:  [facility]  PHYSICIAN:  Madlyn Frankel. Charlann Boxer, M.D.  DATE OF BIRTH:  [birth year]  DATE OF PROCEDURE:  09/30/2015 DATE OF DISCHARGE:                              OPERATIVE REPORT   PREOPERATIVE DIAGNOSIS:  Grade 2-3 open fracture and dislocation of left elbow.  POSTOPERATIVE DIAGNOSIS:  Grade 3C open fracture and dislocation of left elbow with transection of the brachial artery.  PROCEDURES: 1. Excision and irrigation debridement of the left elbow. 2. Closed reduction of left elbow. 3. Procedure performed by Dr. Gretta Began, a repair of brachial artery     using a saphenous vein graft. 4. Primary wound closure. 5. Application of long-arm splint.  SURGEON:  Madlyn Frankel. Charlann Boxer, M.D. for the elbow portions.  Please note that for #3 for the brachial artery repair with saphenous vein graft. This was Dr. Tawanna Cooler Early with Durene Romans assisting and dictated under separate note.  ANESTHESIA:  General.  SPECIMENS:  None.  COMPLICATION:  None.  INDICATION FOR PROCEDURE:  Ms. Duensing is a 45 year old female, right- hand dominant, who was riding a horse first time as she was making her final lap, she was thrown from the horse landing on her left side.  She had immediate deformity, bleeding at the site and open wound.  She was brought to the emergency room for evaluation.  At the time of initial evaluation, there was concern about diminished pulses.  She was taken to the operating room as soon as she was seen by myself.  The risks and benefits of the procedure were discussed briefly with she and her husband, and risks of stiffness of the elbow, nonunion, loss of function of the left elbow, infection were all discussed and reviewed for the benefit of elbow stabilization.  PROCEDURE IN DETAIL:  The patient was brought to the operative theater. Once adequate  anesthesia, preoperative antibiotics, which included that in the ER, 2 g Ancef, gentamicin as well as penicillin based on the location of her injury, she was brought to the operating room.  She was positioned supine.  Once the adequate anesthesia was established, we did pre-drape out the arm and did some initial debridement followed by prep and draping of the left upper extremity.  Please note, during this initial evaluation, I closed, reduced her elbow and despite this, I was unable to really palpate a pulse.  We had brought in a Doppler, only very faintly was a Doppler sound that the radial artery identified.  Within the wound, I could see a pulsating mass that had appeared to be clotted off, which was in fact the proximal aspect of the brachial artery.  Based on these findings, I did contact Dr. Tawanna Cooler Early who was on-call for Vascular Surgery.  Dr. Arbie Cookey was kind and responsive and came into the OR to help evaluate.  Please note that during the time we were waiting for Dr. Arbie Cookey, I finished my irrigation and debridement.  We removed any nonviable soft tissue including skin, muscle and nerve tissue that was bridging the gap, the wound.  I then irrigated the wound  with about 2 liters of normal saline solution and removing the fragments as well as grafts from the joint.  For the time that I finished this, Dr. Arbie Cookey came in, it was able to assess and through dissection identified both the proximal and distal ends of the brachial artery confirming the transection injury.  Based on this assessment, he then carried out his procedure, again dictated to a separate note.  I did serve as an Geophysicist/field seismologist for him during this case.  One he concluded, we irrigated the wound.  He closed the distal wound, I then reapproximated the antecubital wound with 2-0 nylon.  I then dressed this wound with Xeroform and a bulky dressing and then placed her arm into a posterior splint.  She then woken from  anesthesia and brought to the recovery room in stable condition.  Findings and recurrences were reviewed with her husband.  I had initially presented this case to Dr. Bradly Bienenstock for initial consultation and we will garner his supports for definitive management of this as we proceed.     Madlyn Frankel Charlann Boxer, M.D.     MDO/MEDQ  D:  09/30/2015  T:  09/30/2015  Job:  [id number]

## 2015-09-30 NOTE — Anesthesia Postprocedure Evaluation (Signed)
Anesthesia Post Note  Patient: Tracy Wade  Procedure(s) Performed: Procedure(s) (LRB): IRRIGATION AND DEBRIDEMENT,  LEFT ELBOW (Left) TRANSVERSE BRACHIAL ARTERY REPAIR (Left) REVERSE GREATER SAPHENOUS VEIN CLOSED REDUCTION ELBOW (Left)  Patient location during evaluation: PACU Anesthesia Type: General Level of consciousness: awake Pain management: pain level controlled Vital Signs Assessment: post-procedure vital signs reviewed and stable Respiratory status: spontaneous breathing Cardiovascular status: stable Anesthetic complications: no    Last Vitals:  Vitals:   09/30/15 0230 09/30/15 0245  BP: (!) 153/103 (!) 144/101  Pulse: 95 87  Resp: 19 14  Temp: 36.3 C     Last Pain:  Vitals:   09/30/15 0350  TempSrc:   PainSc: 7                  EDWARDS,Allyn Bartelson

## 2015-09-30 NOTE — Consult Note (Signed)
CHART REVIEWED PT SEEN/EXAMINED CT SCAN REVIEWED PT WITH OPEN CONTAMINATED FRACTURE DISLOCATION OF ELBOW WILL PROCEED WITH REPEAT I/D IN OR TOMORROW AND POSSIBLE LATERAL STABILIZATION AND REPAIR/ORIF OF LEFT RADIAL HEAD. DISCUSSED WITH PATIENT PT VOICED UNDERSTANDING AND REASON FOR SURGERY ORDERS PLACED

## 2015-09-30 NOTE — Brief Op Note (Signed)
09/29/2015  1:48 AM  PATIENT:  Tracy Wade  45 y.o. female  PRE-OPERATIVE DIAGNOSIS:  Open Fracture Left Elbow  POST-OPERATIVE DIAGNOSIS:  Grade IIIC open fracture dislocation left elbow with transection of brachial artery   PROCEDURE:  Procedure(s): IRRIGATION AND DEBRIDEMENT,  LEFT ELBOW (Left) TRANSVERSE BRACHIAL ARTERY REPAIR (Left) REVERSE GREATER SAPHENOUS VEIN CLOSED REDUCTION ELBOW (Left)  Application long arm splint  SURGEON:  Surgeon(s) and Role: Panel 1:    * Durene Romans, MD - Primary      Panel 2:    * Larina Earthly, MD - Primary  Durene Romans, MD - Assist  PHYSICIAN ASSISTANT: None  ANESTHESIA:   general  EBL:  Total I/O In: 2000 [I.V.:2000] Out: 700 [Urine:600; Blood:100]  BLOOD ADMINISTERED:none  DRAINS: none   LOCAL MEDICATIONS USED:  NONE  SPECIMEN:  No Specimen  DISPOSITION OF SPECIMEN:  N/A  COUNTS:  YES  TOURNIQUET:  * No tourniquets in log *  DICTATION: .Other Dictation: Dictation Number [id number]  PLAN OF CARE: Admit to inpatient   PATIENT DISPOSITION:  PACU - hemodynamically stable.   Delay start of Pharmacological VTE agent (>24hrs) due to surgical blood loss or risk of bleeding: no

## 2015-09-30 NOTE — Progress Notes (Signed)
Patient ID: Tracy Wade, female   DOB: [birth year], 45 y.o.   MRN: [id number]   LOS: 1 day   Subjective: Pain, N/T in LUE   Objective: Vital signs in last 24 hours: Temp:  [97.2 F (36.2 C)-98.8 F (37.1 C)] 97.7 F (36.5 C) (07/25 0603) Pulse Rate:  [87-104] 93 (07/25 0603) Resp:  [10-27] 14 (07/25 0245) BP: (140-191)/(92-113) 156/107 (07/25 0603) SpO2:  [94 %-100 %] 96 % (07/25 0603) Weight:  [72.2 kg (159 lb 2.8 oz)-79.4 kg (175 lb)] 72.2 kg (159 lb 2.8 oz) (07/25 0245)    Laboratory  CBC  Recent Labs  09/29/15 2143 09/29/15 2158 09/30/15 0418  WBC 16.2*  --  10.7*  HGB 14.3 15.0 12.6  HCT 42.4 44.0 37.6  PLT 244  --  219   BMET  Recent Labs  09/29/15 2143 09/29/15 2158 09/30/15 0418  NA 138 139  --   K 3.6 3.4*  --   CL 107 105  --   CO2 20*  --   --   GLUCOSE 145* 149*  --   BUN 16 17  --   CREATININE 0.96 0.80 0.83  CALCIUM 8.8*  --   --     Physical Exam General appearance: alert and no distress Resp: clear to auscultation bilaterally Cardio: regular rate and rhythm GI: normal findings: bowel sounds normal and soft, non-tender Extremities: Warm, splinted   Assessment/Plan: Fall from horse Open left elbow fx s/p I&D, splinting -- per Dr. Charlann Boxer, definitive repair by Dr. Melvyn Novas this week. On Ancef, Gent, Pen G for prophylaxis Left brachial artery injury s/p graft -- per Dr. Arbie Cookey FEN -- SL IV VTE -- SCD's, Lovenox Dispo -- OR this week    Freeman Caldron, PA-C Pager: [phone] General Trauma PA Pager: [phone]  09/30/2015

## 2015-09-30 NOTE — Transfer of Care (Signed)
Immediate Anesthesia Transfer of Care Note  Patient: Clyde A Game  Procedure(s) Performed: Procedure(s): IRRIGATION AND DEBRIDEMENT,  LEFT ELBOW (Left) TRANSVERSE BRACHIAL ARTERY REPAIR (Left) REVERSE GREATER SAPHENOUS VEIN CLOSED REDUCTION ELBOW (Left)  Patient Location: PACU  Anesthesia Type:General  Level of Consciousness: awake, alert  and oriented  Airway & Oxygen Therapy: Patient Spontanous Breathing and Patient connected to nasal cannula oxygen  Post-op Assessment: Report given to RN and Post -op Vital signs reviewed and stable  Post vital signs: Reviewed and stable  Last Vitals:  Vitals:   09/30/15 0230 09/30/15 0245  BP: (!) 153/103   Pulse: 95 87  Resp: 19 14  Temp: 36.3 C     Last Pain:  Vitals:   09/30/15 0230  TempSrc:   PainSc: 6          Complications: No apparent anesthesia complications

## 2015-09-30 NOTE — Op Note (Signed)
    OPERATIVE REPORT  DATE OF SURGERY: 09/30/2015  PATIENT: Tracy Wade, 45 y.o. female MRN: [id number]  DOB: 02-04-1971  PRE-OPERATIVE DIAGNOSIS: Left brachial artery injury with open fracture dislocation of elbow  POST-OPERATIVE DIAGNOSIS:  Same  PROCEDURE: Debridement and interposition graft of reversed great saphenous vein brachial artery, left  SURGEON:  Gretta Began, M.D.  ASSISTANT: Dr. Lajoyce Corners  ANESTHESIA:  Gen.  EBL: 100 ml  Total I/O In: 2000 [I.V.:2000] Out: 700 [Urine:600; Blood:100]  BLOOD ADMINISTERED: None  DRAINS: None  SPECIMEN: None  COUNTS CORRECT:  YES  PLAN OF CARE: PACU   PATIENT DISPOSITION:  PACU - hemodynamically stable  PROCEDURE DETAILS: The patient is a 45 year old woman who had a open fracture dislocation of her left elbow after falling from a horse evening of the procedure. She was taken to the operating room by Dr.Olin for debridement and fixation. She was found to have minimal flow in her left wrist by Doppler and I was consult for evaluation of potential vascular injury. On exploration of the open laceration for her antecubital wound the proximal end of the brachial artery could be seen pulsating in the wound. The artery was explored further proximally. There is a hematoma in the artery itself for approximately 1/2 cm. The artery was mobilized further proximally above this area of injury. The patient was given 6000 intravenous heparin. The artery was occluded proximal to the area of obvious injury and the artery was transected. There was no evidence of injury to the artery this point. The artery was opened and spatulated with Potts scissors. Next the wound was explored to identify the distal brachial artery. This also had the contracted and was not bleeding. This artery was also transected below the level of obvious injury and was spatulated as well. There was no backbleeding from this. A 3 Fogarty catheter was passed down to the wrist and  was withdrawn with no thrombus removed. A separate incision was made over the left ankle over the area of the saphenous vein. The patient had very nice caliber saphenous vein at this level. The vein was harvested for approximately 4 cm. The vein was ligated proximally and distally with 2-0 silk ties and divided. The vein was gently dilated was of excellent caliber for bypass. The vein was spatulated and was placed in reverse fashion. The vein was sewn into into the proximal artery with a running 6-0 Prolene suture. This anastomosis was tested and found be adequate. There was excellent flow through the vein graft. The vein graft was then cut to the appropriate length and was spatulated and sewn end to end to the distal brachial artery with a running 6-0 Prolene suture. Prior to the closure the usual flushing maneuvers were undertaken and there was good backbleeding from the distal artery. The anastomosis was completed and the clamps were removed. There is a very good Doppler signal in the brachial artery below the anastomosis and also good ulnar flow. Radial flow was not heard this was felt to be related to passing of the 3 Fogarty passed down the radial artery. The heparin was not reversed. The antecubital wound was closed by Dr. Charlann Boxer. The left vein harvest wound was closed by myself with 3-0 Vicryl in the subcutaneous and subcuticular tissue. Benzoin and Steri-Strips were applied. She'll dressing was applied and the patient was transferred to the recovery room in stable condition   Gretta Began, M.D. 09/30/2015 1:37 AM

## 2015-09-30 NOTE — Progress Notes (Addendum)
[facility]  Subjective  - Pain issue with nausea.  Staes fingers are tingling.   Objective (!) 156/107 93 97.7 F (36.5 C) (Oral) 14 96%  Intake/Output Summary (Last 24 hours) at 09/30/15 6759 Last data filed at 09/30/15 0145  Gross per 24 hour  Intake             2200 ml  Output              700 ml  Net             1500 ml    Active minimal finger range of motion left hand Palpable radial pulse with cap refill Posterior splint in place clean and dry  Assessment/Planning: POD # 1 Debridement and interposition graft of reversed great saphenous vein brachial artery, left  Encourage active range of motion left fingers Pending further surgery per ortho  Tracy Wade [facility] 09/30/2015 8:33 AM --  Laboratory Lab Results:  Recent Labs  09/29/15 2143 09/29/15 2158 09/30/15 0418  WBC 16.2*  --  10.7*  HGB 14.3 15.0 12.6  HCT 42.4 44.0 37.6  PLT 244  --  219   BMET  Recent Labs  09/29/15 2143 09/29/15 2158 09/30/15 0418  NA 138 139  --   K 3.6 3.4*  --   CL 107 105  --   CO2 20*  --   --   GLUCOSE 145* 149*  --   BUN 16 17  --   CREATININE 0.96 0.80 0.83  CALCIUM 8.8*  --   --     COAG Lab Results  Component Value Date   INR 0.94 09/29/2015   No results found for: PTT   I have examined the patient, reviewed and agree with above.Comfortable. Easily palpable left radial pulse.  Tracy Began, MD 09/30/2015 12:35 PM

## 2015-10-01 ENCOUNTER — Encounter (HOSPITAL_COMMUNITY): Payer: Self-pay | Admitting: Anesthesiology

## 2015-10-01 ENCOUNTER — Inpatient Hospital Stay (HOSPITAL_COMMUNITY): Payer: BC Managed Care – PPO | Admitting: Anesthesiology

## 2015-10-01 ENCOUNTER — Encounter (HOSPITAL_COMMUNITY)
Admission: EM | Disposition: A | Discharge status: Home / Self Care | Payer: Self-pay | Source: Home / Self Care | Attending: Orthopedic Surgery

## 2015-10-01 HISTORY — PX: I & D EXTREMITY: SHX5045

## 2015-10-01 LAB — SURGICAL PCR SCREEN
MRSA, PCR: NEGATIVE
STAPHYLOCOCCUS AUREUS: NEGATIVE

## 2015-10-01 SURGERY — IRRIGATION AND DEBRIDEMENT EXTREMITY
Anesthesia: General | Site: Arm Upper | Laterality: Left

## 2015-10-01 MED ORDER — PROMETHAZINE HCL 25 MG/ML IJ SOLN: 6.2500 mg | INTRAMUSCULAR | Status: DC | PRN

## 2015-10-01 MED ORDER — MIDAZOLAM HCL 2 MG/2ML IJ SOLN
INTRAMUSCULAR | Status: AC
  Administered 2015-10-01: 1 mg
  Filled 2015-10-01: qty 2

## 2015-10-01 MED ORDER — FENTANYL CITRATE (PF) 100 MCG/2ML IJ SOLN
INTRAMUSCULAR | Status: DC | PRN
  Administered 2015-10-01: 100 ug via INTRAVENOUS

## 2015-10-01 MED ORDER — PHENYLEPHRINE HCL 10 MG/ML IJ SOLN
INTRAMUSCULAR | Status: DC | PRN
  Administered 2015-10-01 (×2): 80 ug via INTRAVENOUS

## 2015-10-01 MED ORDER — ONDANSETRON HCL 4 MG/2ML IJ SOLN
INTRAMUSCULAR | Status: AC
  Filled 2015-10-01: qty 2

## 2015-10-01 MED ORDER — HYDROMORPHONE 1 MG/ML IV SOLN
INTRAVENOUS | Status: AC
  Filled 2015-10-01: qty 25

## 2015-10-01 MED ORDER — ONDANSETRON HCL 4 MG/2ML IJ SOLN
INTRAMUSCULAR | Status: DC | PRN
  Administered 2015-10-01: 4 mg via INTRAVENOUS

## 2015-10-01 MED ORDER — WHITE PETROLATUM GEL
Status: AC
  Administered 2015-10-01: 16:00:00
  Filled 2015-10-01: qty 1

## 2015-10-01 MED ORDER — BUPIVACAINE HCL (PF) 0.25 % IJ SOLN
INTRAMUSCULAR | Status: AC
  Filled 2015-10-01: qty 30

## 2015-10-01 MED ORDER — PHENYLEPHRINE HCL 10 MG/ML IJ SOLN
INTRAVENOUS | Status: DC | PRN
  Administered 2015-10-01: 50 ug/min via INTRAVENOUS

## 2015-10-01 MED ORDER — MEPERIDINE HCL 25 MG/ML IJ SOLN
INTRAMUSCULAR | Status: AC
  Administered 2015-10-01: 6.25 mg via INTRAVENOUS
  Filled 2015-10-01: qty 1

## 2015-10-01 MED ORDER — HYDROMORPHONE HCL 1 MG/ML IJ SOLN: 0.2500 mg | INTRAMUSCULAR | Status: DC | PRN

## 2015-10-01 MED ORDER — LACTATED RINGERS IV SOLN: INTRAVENOUS | Status: DC

## 2015-10-01 MED ORDER — CO Q 10 10 MG PO CAPS: ORAL_CAPSULE | Freq: Every day | ORAL | Status: DC

## 2015-10-01 MED ORDER — LACTATED RINGERS IV SOLN
INTRAVENOUS | Status: DC
  Administered 2015-10-01 – 2015-10-05 (×4): via INTRAVENOUS

## 2015-10-01 MED ORDER — BUPIVACAINE-EPINEPHRINE (PF) 0.5% -1:200000 IJ SOLN
INTRAMUSCULAR | Status: DC | PRN
  Administered 2015-10-01: 30 mL via PERINEURAL

## 2015-10-01 MED ORDER — PHENYLEPHRINE HCL 10 MG/ML IJ SOLN
INTRAMUSCULAR | Status: AC
  Filled 2015-10-01: qty 1

## 2015-10-01 MED ORDER — PROPOFOL 10 MG/ML IV BOLUS
INTRAVENOUS | Status: DC | PRN
  Administered 2015-10-01: 200 mg via INTRAVENOUS

## 2015-10-01 MED ORDER — FENTANYL CITRATE (PF) 250 MCG/5ML IJ SOLN
INTRAMUSCULAR | Status: AC
  Filled 2015-10-01: qty 5

## 2015-10-01 MED ORDER — MIDAZOLAM HCL 5 MG/5ML IJ SOLN
INTRAMUSCULAR | Status: DC | PRN
  Administered 2015-10-01: 2 mg via INTRAVENOUS

## 2015-10-01 MED ORDER — PHENYLEPHRINE 40 MCG/ML (10ML) SYRINGE FOR IV PUSH (FOR BLOOD PRESSURE SUPPORT)
PREFILLED_SYRINGE | INTRAVENOUS | Status: AC
  Filled 2015-10-01: qty 10

## 2015-10-01 MED ORDER — MEPERIDINE HCL 25 MG/ML IJ SOLN
6.2500 mg | INTRAMUSCULAR | Status: DC | PRN
  Administered 2015-10-01 (×2): 6.25 mg via INTRAVENOUS

## 2015-10-01 MED ORDER — SUCCINYLCHOLINE CHLORIDE 200 MG/10ML IV SOSY
PREFILLED_SYRINGE | INTRAVENOUS | Status: DC | PRN
  Administered 2015-10-01: 20 mg via INTRAVENOUS

## 2015-10-01 MED ORDER — MIDAZOLAM HCL 2 MG/2ML IJ SOLN
INTRAMUSCULAR | Status: AC
  Filled 2015-10-01: qty 2

## 2015-10-01 MED ORDER — SODIUM CHLORIDE 0.9 % IR SOLN
Status: DC | PRN
Start: 2015-10-01 — End: 2015-10-01
  Administered 2015-10-01: 1000 mL

## 2015-10-01 MED ORDER — SODIUM CHLORIDE 0.9 % IR SOLN
Status: DC | PRN
  Administered 2015-10-01: 3000 mL

## 2015-10-01 MED ORDER — LACTATED RINGERS IV SOLN
INTRAVENOUS | Status: DC | PRN
  Administered 2015-10-01 (×2): via INTRAVENOUS

## 2015-10-01 MED ORDER — FLUTICASONE PROPIONATE 50 MCG/ACT NA SUSP
1.0000 | Freq: Every day | NASAL | Status: DC | PRN
  Filled 2015-10-01: qty 16

## 2015-10-01 MED ORDER — FENTANYL CITRATE (PF) 100 MCG/2ML IJ SOLN
INTRAMUSCULAR | Status: AC
  Administered 2015-10-01: 50 ug
  Filled 2015-10-01: qty 2

## 2015-10-01 MED ORDER — LIDOCAINE 2% (20 MG/ML) 5 ML SYRINGE
INTRAMUSCULAR | Status: AC
  Filled 2015-10-01: qty 5

## 2015-10-01 MED ORDER — PROPOFOL 10 MG/ML IV BOLUS
INTRAVENOUS | Status: AC
  Filled 2015-10-01: qty 20

## 2015-10-01 SURGICAL SUPPLY — 49 items
BANDAGE ACE 6X5 VEL STRL LF (GAUZE/BANDAGES/DRESSINGS) ×3 IMPLANT
BANDAGE ELASTIC 3 VELCRO ST LF (GAUZE/BANDAGES/DRESSINGS) ×4 IMPLANT
BANDAGE ELASTIC 4 VELCRO ST LF (GAUZE/BANDAGES/DRESSINGS) ×4 IMPLANT
BNDG COHESIVE 4X5 TAN STRL (GAUZE/BANDAGES/DRESSINGS) ×4 IMPLANT
BNDG GAUZE ELAST 4 BULKY (GAUZE/BANDAGES/DRESSINGS) ×4 IMPLANT
CORDS BIPOLAR (ELECTRODE) ×4 IMPLANT
COVER SURGICAL LIGHT HANDLE (MISCELLANEOUS) ×4 IMPLANT
CUFF TOURNIQUET SINGLE 18IN (TOURNIQUET CUFF) ×4 IMPLANT
DRAPE ORTHO SPLIT 77X108 STRL (DRAPES) ×8
DRAPE SURG 17X23 STRL (DRAPES) ×4 IMPLANT
DRAPE SURG ORHT 6 SPLT 77X108 (DRAPES) ×4 IMPLANT
DRAPE U-SHAPE 47X51 STRL (DRAPES) ×4 IMPLANT
DRSG ADAPTIC 3X8 NADH LF (GAUZE/BANDAGES/DRESSINGS) ×4 IMPLANT
GAUZE SPONGE 4X4 12PLY STRL (GAUZE/BANDAGES/DRESSINGS) ×4 IMPLANT
GAUZE XEROFORM 1X8 LF (GAUZE/BANDAGES/DRESSINGS) ×4 IMPLANT
GAUZE XEROFORM 5X9 LF (GAUZE/BANDAGES/DRESSINGS) ×3 IMPLANT
GLOVE BIOGEL PI IND STRL 8.5 (GLOVE) ×2 IMPLANT
GLOVE BIOGEL PI INDICATOR 8.5 (GLOVE) ×2
GLOVE SURG ORTHO 8.0 STRL STRW (GLOVE) ×4 IMPLANT
GOWN STRL REUS W/ TWL LRG LVL3 (GOWN DISPOSABLE) ×5 IMPLANT
GOWN STRL REUS W/ TWL XL LVL3 (GOWN DISPOSABLE) ×2 IMPLANT
GOWN STRL REUS W/TWL LRG LVL3 (GOWN DISPOSABLE) ×8
GOWN STRL REUS W/TWL XL LVL3 (GOWN DISPOSABLE) ×4
KIT BASIN OR (CUSTOM PROCEDURE TRAY) ×4 IMPLANT
KIT ROOM TURNOVER OR (KITS) ×4 IMPLANT
MANIFOLD NEPTUNE II (INSTRUMENTS) ×4 IMPLANT
NS IRRIG 1000ML POUR BTL (IV SOLUTION) ×4 IMPLANT
PACK ORTHO EXTREMITY (CUSTOM PROCEDURE TRAY) ×4 IMPLANT
PAD ARMBOARD 7.5X6 YLW CONV (MISCELLANEOUS) ×8 IMPLANT
PAD CAST 4YDX4 CTTN HI CHSV (CAST SUPPLIES) ×2 IMPLANT
PADDING CAST ABS 4INX4YD NS (CAST SUPPLIES) ×4
PADDING CAST ABS COTTON 4X4 ST (CAST SUPPLIES) ×2 IMPLANT
PADDING CAST COTTON 4X4 STRL (CAST SUPPLIES) ×4
SLING ARM IMMOBILIZER MED (SOFTGOODS) ×3 IMPLANT
SPLINT FIBERGLASS 3X35 (CAST SUPPLIES) ×3 IMPLANT
SPONGE LAP 18X18 X RAY DECT (DISPOSABLE) ×4 IMPLANT
SPONGE LAP 4X18 X RAY DECT (DISPOSABLE) ×4 IMPLANT
SUCTION FRAZIER HANDLE 10FR (MISCELLANEOUS) ×2
SUCTION TUBE FRAZIER 10FR DISP (MISCELLANEOUS) ×2 IMPLANT
SUT PROLENE 3 0 PS 2 (SUTURE) ×3 IMPLANT
SUT VIC AB 2-0 CT1 27 (SUTURE) ×4
SUT VIC AB 2-0 CT1 TAPERPNT 27 (SUTURE) ×1 IMPLANT
TOWEL OR 17X24 6PK STRL BLUE (TOWEL DISPOSABLE) ×4 IMPLANT
TOWEL OR 17X26 10 PK STRL BLUE (TOWEL DISPOSABLE) ×8 IMPLANT
TUBE CONNECTING 12'X1/4 (SUCTIONS) ×1
TUBE CONNECTING 12X1/4 (SUCTIONS) ×3 IMPLANT
UNDERPAD 30X30 INCONTINENT (UNDERPADS AND DIAPERS) ×4 IMPLANT
WATER STERILE IRR 1000ML POUR (IV SOLUTION) ×4 IMPLANT
YANKAUER SUCT BULB TIP NO VENT (SUCTIONS) ×4 IMPLANT

## 2015-10-01 NOTE — Progress Notes (Signed)
PT SEEN/EXAMINED PLAN TONIGHT FOR EXAMINATION UNDER ANESTHESIA, AND WOUND EXPLORATION AND IRRIGATION AND DEBRIDEMENT POSSIBLE OPEN REDUCTION AND INTERNAL FIXATION AND REPAIR PT VOICED UNDERSTANDING OF PLAN R/B/A DISCUSSED WITH PT IN HOSPITAL PT VOICED UNDERSTANDING OF PLAN CONSENT SIGNED DAY OF SURGERY PT SEEN AND EXAMINED PRIOR TO OPERATIVE PROCEDURE/DAY OF SURGERY SITE MARKED. QUESTIONS ANSWERED WILL REMAIN AN INPATIENT FOLLOWING SURGERY

## 2015-10-01 NOTE — Anesthesia Postprocedure Evaluation (Signed)
Anesthesia Post Note  Patient: Tracy Wade  Procedure(s) Performed: Procedure(s) (LRB): IRRIGATION AND DEBRIDEMENT LEFT ELBOW (Left)  Patient location during evaluation: PACU Anesthesia Type: General and Regional Level of consciousness: awake and alert Pain management: pain level controlled Vital Signs Assessment: post-procedure vital signs reviewed and stable Respiratory status: spontaneous breathing, nonlabored ventilation, respiratory function stable and patient connected to nasal cannula oxygen Cardiovascular status: blood pressure returned to baseline and stable Postop Assessment: no signs of nausea or vomiting Anesthetic complications: no    Last Vitals:  Vitals:   10/01/15 2135 10/01/15 2145  BP: 129/88   Pulse: (!) 115 (!) 114  Resp: 17 18  Temp:  36.7 C    Last Pain:  Vitals:   10/01/15 2130  TempSrc:   PainSc: 0-No pain                 Marisela Line,JAMES TERRILL

## 2015-10-01 NOTE — Clinical Social Work Note (Signed)
CSW attempted to complete assessment and SBIRT, but patient was in surgery, CSW will try at another time.  Tracy Wade. [name], MSW, Theresia Majors [phone] 10/01/2015 5:17 PM

## 2015-10-01 NOTE — Brief Op Note (Signed)
09/29/2015 - 10/01/2015  6:52 PM  PATIENT:  Tracy Wade  45 y.o. female  PRE-OPERATIVE DIAGNOSIS:  Fractured Left elbow  POST-OPERATIVE DIAGNOSIS:  * No post-op diagnosis entered *  PROCEDURE:  Procedure(s): OPEN REDUCTION INTERNAL FIXATION (ORIF) ELBOW/OLECRANON FRACTURE (Left) IRRIGATION AND DEBRIDEMENT LEFT ELBOW (Left)  SURGEON:  Surgeon(s) and Role:    * Bradly Bienenstock, MD - Primary  PHYSICIAN ASSISTANT:   ASSISTANTS: none   ANESTHESIA:   general  EBL:  Total I/O In: 374.5 [I.V.:224.5; IV Piggyback:150] Out: 1200 [Urine:1200]  BLOOD ADMINISTERED:none  DRAINS: none   LOCAL MEDICATIONS USED:  NONE  SPECIMEN:  No Specimen  DISPOSITION OF SPECIMEN:  N/A  COUNTS:  YES  TOURNIQUET:    DICTATION: .Other Dictation: Dictation Number [id number]  PLAN OF CARE: Admit to inpatient   PATIENT DISPOSITION:  PACU - hemodynamically stable.   Delay start of Pharmacological VTE agent (>24hrs) due to surgical blood loss or risk of bleeding: not applicable  PLAN: RETURN TO OR ON Friday FOR REPEAT IRRIGATION AND DEBRIDEMENT AND POSSIBLE RADIAL HEAD ORIF/ARTHROPLASTY

## 2015-10-01 NOTE — Anesthesia Procedure Notes (Signed)
Procedure Name: LMA Insertion Date/Time: 10/01/2015 7:09 PM Performed by: Gavin Pound, Shelbia Scinto J Pre-anesthesia Checklist: Patient identified, Emergency Drugs available, Suction available, Patient being monitored and Timeout performed Patient Re-evaluated:Patient Re-evaluated prior to inductionOxygen Delivery Method: Circle system utilized Preoxygenation: Pre-oxygenation with 100% oxygen Intubation Type: IV induction Ventilation: Mask ventilation without difficulty LMA: LMA inserted LMA Size: 4.0 Number of attempts: 1 Placement Confirmation: positive ETCO2 and breath sounds checked- equal and bilateral Tube secured with: Tape Dental Injury: Teeth and Oropharynx as per pre-operative assessment

## 2015-10-01 NOTE — Anesthesia Procedure Notes (Signed)
Anesthesia Regional Block:  Supraclavicular block  Pre-Anesthetic Checklist: ,, timeout performed, Correct Patient, Correct Site, Correct Laterality, Correct Procedure, Correct Position, site marked, Risks and benefits discussed,  Surgical consent,  Pre-op evaluation,  At surgeon's request and post-op pain management  Laterality: Left  Prep: chloraprep       Needles:  Injection technique: Single-shot  Needle Type: Echogenic Needle     Needle Length: 9cm 9 cm Needle Gauge: 21 and 21 G    Additional Needles:  Procedures: ultrasound guided (picture in chart) Supraclavicular block Narrative:  Start time: 10/01/2015 5:32 PM End time: 10/01/2015 5:35 PM Injection made incrementally with aspirations every 5 mL.  Performed by: Personally  Anesthesiologist: Shona Simpson D  Additional Notes: No immediate complications noted. Pt tolerated well.

## 2015-10-01 NOTE — Anesthesia Preprocedure Evaluation (Addendum)
Anesthesia Evaluation  Patient identified by MRN, date of birth, ID band Patient awake    Reviewed: Allergy & Precautions, NPO status , Patient's Chart, lab work & pertinent test results  Airway Mallampati: II  TM Distance: >3 FB Neck ROM: Full    Dental  (+) Dental Advisory Given, Teeth Intact   Pulmonary neg pulmonary ROS,    breath sounds clear to auscultation       Cardiovascular hypertension, + Peripheral Vascular Disease   Rhythm:Regular Rate:Normal     Neuro/Psych PSYCHIATRIC DISORDERS Anxiety negative neurological ROS     GI/Hepatic Neg liver ROS, GERD  Medicated,  Endo/Other  negative endocrine ROS  Renal/GU negative Renal ROS  negative genitourinary   Musculoskeletal negative musculoskeletal ROS (+)   Abdominal   Peds negative pediatric ROS (+)  Hematology negative hematology ROS (+)   Anesthesia Other Findings   Reproductive/Obstetrics negative OB ROS                            Lab Results  Component Value Date   WBC 10.7 (H) 09/30/2015   HGB 12.6 09/30/2015   HCT 37.6 09/30/2015   MCV 90.8 09/30/2015   PLT 219 09/30/2015   Lab Results  Component Value Date   CREATININE 0.83 09/30/2015   BUN 17 09/29/2015   NA 139 09/29/2015   K 3.4 (L) 09/29/2015   CL 105 09/29/2015   CO2 20 (L) 09/29/2015   Lab Results  Component Value Date   INR 0.94 09/29/2015    Anesthesia Physical Anesthesia Plan  ASA: II  Anesthesia Plan: General   Post-op Pain Management: GA combined w/ Regional for post-op pain   Induction: Intravenous  Airway Management Planned: LMA  Additional Equipment:   Intra-op Plan:   Post-operative Plan: Extubation in OR  Informed Consent: I have reviewed the patients History and Physical, chart, labs and discussed the procedure including the risks, benefits and alternatives for the proposed anesthesia with the patient or authorized representative  who has indicated his/her understanding and acceptance.     Plan Discussed with: CRNA  Anesthesia Plan Comments:        Anesthesia Quick Evaluation

## 2015-10-01 NOTE — Progress Notes (Signed)
Patient ID: Tracy Wade, female   DOB: [birth year], 45 y.o.   MRN: [id number]   LOS: 2 days   Subjective: Sleeping, no new c/o.   Objective: Vital signs in last 24 hours: Temp:  [98 F (36.7 C)-99.4 F (37.4 C)] 99.1 F (37.3 C) (07/26 0523) Pulse Rate:  [80-112] 104 (07/26 0523) Resp:  [12-22] 16 (07/26 0524) BP: (105-157)/(63-96) 125/87 (07/26 0523) SpO2:  [100 %] 100 % (07/26 0524) FiO2 (%):  [100 %] 100 % (07/26 0524) Last BM Date:  (Not yet)   Physical Exam General appearance: alert and no distress Resp: clear to auscultation bilaterally Cardio: Mild tachycardia GI: normal findings: bowel sounds normal and soft, non-tender Extremities: NVI   Assessment/Plan: Fall from horse Open left elbow fx s/p I&D, splinting -- per Dr. Charlann Boxer, definitive repair by Dr. Melvyn Novas this week. On Ancef, Gent, Pen G for prophylaxis Left brachial artery injury s/p graft -- per Dr. Arbie Cookey FEN -- Plan convert to oral pain meds after surgery, either this afternoon or tomorrow VTE -- SCD's, Lovenox Dispo -- OR today    Freeman Caldron, PA-C Pager: [phone] General Trauma PA Pager: [phone]  10/01/2015

## 2015-10-01 NOTE — Care Management Note (Signed)
Case Management Note  Patient Details  Name: LEVI CRASS MRN: [id number] Date of Birth: November 22, 1970  Subjective/Objective: Pt admitted on 09/29/15 s/p fall from horse with open fracture of LT elbow.  PTA, pt independent, lives at home with husband and children.  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                     Action/Plan: Met with pt to discuss dc plans.  She states husband can assist at dc, with friends to help.  Will follow for discharge planning as pt progresses.  Further elbow surgery planned for today.    Expected Discharge Date:                  Expected Discharge Plan:  Home/Self Care  In-House Referral:     Discharge planning Services  CM Consult  Post Acute Care Choice:    Choice offered to:     DME Arranged:    DME Agency:     HH Arranged:    HH Agency:     Status of Service:  In process, will continue to follow  If discussed at Long Length of Stay Meetings, dates discussed:    Additional Comments:  Reinaldo Raddle, RN, BSN  Trauma/Neuro ICU Case Manager [phone]

## 2015-10-01 NOTE — Progress Notes (Addendum)
PCA discontinued for OR.  RN wasted 31mL Dilaudid in sink, Pyxis B, with Erin Hearing, RN .

## 2015-10-01 NOTE — Transfer of Care (Signed)
Immediate Anesthesia Transfer of Care Note  Patient: Tracy Wade  Procedure(s) Performed: Procedure(s) with comments: IRRIGATION AND DEBRIDEMENT LEFT ELBOW (Left) - left elbow  Patient Location: PACU  Anesthesia Type:General  Level of Consciousness: awake  Airway & Oxygen Therapy: Patient Spontanous Breathing  Post-op Assessment: Report given to RN and Post -op Vital signs reviewed and stable  Post vital signs: Reviewed and stable  Last Vitals:  Vitals:   10/01/15 1825 10/01/15 2051  BP: 121/75 110/86  Pulse: (!) 108 100  Resp: 15 13  Temp:  (P) 36.6 C    Last Pain:  Vitals:   10/01/15 1434  TempSrc: Oral  PainSc:          Complications: No apparent anesthesia complications

## 2015-10-02 ENCOUNTER — Encounter (HOSPITAL_COMMUNITY): Payer: Self-pay | Admitting: Orthopedic Surgery

## 2015-10-02 MED ORDER — GENTAMICIN SULFATE 40 MG/ML IJ SOLN
7.0000 mg/kg | INTRAVENOUS | Status: DC
  Administered 2015-10-02 – 2015-10-04 (×3): 420 mg via INTRAVENOUS
  Filled 2015-10-02 (×4): qty 10.5

## 2015-10-02 NOTE — Progress Notes (Signed)
Pt back from OR s/p ORIF left elbow with olecranon fracture/ I&D, alert and responsive with compression wrapped left elbow with sling , warm to touch, can wiggle fingers, nail capillary <2 secs, dressing dry and intact, dilaudid PCA  will continue to monitor.

## 2015-10-02 NOTE — Progress Notes (Signed)
Pharmacy Antibiotic Note  Tracy Wade is a 45 y.o. female admitted on 09/29/2015 with open L elbow fx.  Pharmacy consulted for gentamicin dosing.  She has been on ancef/gent/PCN per open fracture guidelines since 4/24 PM.  Her MRSA PCR is neg.  WBC 16.2>10.7, afebrile. Scr 0.83 on 7/25  She has had I&D of L elbow x 2, with a 3rd planned for tomorrow.  Her wt in EPIC has changed from 79.5>72.2>70.2 today.  ABW 60 kg.  Plan: decrease gent dose to 420 mg (7 mg/kg ABW) based on current wt Check BMET in am for q72 hr renal fxn check  Height: 5\' 3"  (160 cm) Weight: 155 lb 13.8 oz (70.7 kg) IBW/kg (Calculated) : 52.4  Temp (24hrs), Avg:98.7 F (37.1 C), Min:97.9 F (36.6 C), Max:99.9 F (37.7 C)   Recent Labs Lab 09/29/15 2143 09/29/15 2158 09/30/15 0418  WBC 16.2*  --  10.7*  CREATININE 0.96 0.80 0.83  LATICACIDVEN  --  4.55*  --     Estimated Creatinine Clearance: 80.7 mL/min (by C-G formula based on SCr of 0.83 mg/dL).    Allergies  Allergen Reactions  . Erythromycin Nausea And Vomiting  . Sulfa Antibiotics Rash    Antimicrobials this admission: Cefazolin 7/24 >> Gentamicin 7/24>> Pen G 7/24>>  Dose adjustments this admission: Gent dose changed 7/27 2nd updated wt in EPIC  Microbiology results:  7/26 MRSA PCR neg  Thank you for allowing pharmacy to be a part of this patient's care.  Herby Abraham, Pharm.D. [phone] 10/02/2015 11:38 AM

## 2015-10-02 NOTE — Progress Notes (Signed)
Patient ID: Fonnie Jarvis, female   DOB: Jun 23, 1970, 45 y.o.   MRN: [id number]   LOS: 3 days   Subjective: No new c/o.   Objective: Vital signs in last 24 hours: Temp:  [97.9 F (36.6 C)-100.7 F (38.2 C)] 98.8 F (37.1 C) (07/27 0642) Pulse Rate:  [99-142] 111 (07/27 0642) Resp:  [12-22] 17 (07/27 0642) BP: (110-138)/(70-93) 125/70 (07/27 0642) SpO2:  [94 %-100 %] 97 % (07/27 0642) FiO2 (%):  [100 %] 100 % (07/27 0424) Last BM Date:  (Not yet on this admission)   Physical Exam General appearance: alert and no distress Resp: clear to auscultation bilaterally Cardio: regular rate and rhythm GI: normal findings: bowel sounds normal and soft, non-tender Extremities: Tingling in fingers, warm   Assessment/Plan: Fall from horse Open left elbow fx s/p I&D, splinting-- per Dr. Melvyn Novas, repeat I&D yesterday, plan to return to OR Friday. On Ancef, Gent, Pen G for prophylaxis. Left brachial artery injury s/p graft -- per Dr. Arbie Cookey FEN-- D/C foley VTE-- SCD's, Lovenox Dispo-- OR Friday. Will transfer attending to Dr. Melvyn Novas. Trauma will sign off.    Freeman Caldron, PA-C Pager: [phone] General Trauma PA Pager: [phone]  10/02/2015

## 2015-10-02 NOTE — Progress Notes (Signed)
When the patient came back from OR I didn't noticed the pt is already set up for dilaudid PCA, the dilaudid I took from pyxis I wasted it with Harriett Sine C.

## 2015-10-02 NOTE — Progress Notes (Signed)
PT SEEN/EXAMINED DOING OK LUE: HAND WARM WIGGLES FINGERS, ABLE TO EXTEND THUMB AND FINGERS, LIMITED DIGITAL FLEXION PLAN: RETURN TO OR TOMORROW FOR REPEAT I/D AND POSSIBLE RADIAL HEAD REPLACEMENT/ARTHROPLASTY PT VOICED UNDERSTANDING OF PLAN AND QUESTIONS ANSWERED

## 2015-10-02 NOTE — Progress Notes (Signed)
Patient ID: Tracy Wade, female   DOB: Oct 12, 1970, 45 y.o.   MRN: [id number] Reports pain in her elbow. Still with some tingling in her left hand. Her left hand is warm and well perfused. Unableto palpate her radial pulse under the Ace wrap dressing which extends up onto her hand. Reports that she is to return to the operating room on Friday for continued treatment of her elbow. I am out of town tomorrow and through the [place] part of next week. Will not follow actively. Please call my partners if there are any vascular concerns. I will see her in the office in 2 weeks.

## 2015-10-02 NOTE — Op Note (Signed)
NAMESHAYLI, ALTEMOSE NO.:  [id number]  MEDICAL RECORD NO.:  [id number]  LOCATION:  [id number]                        FACILITY:  [facility]  PHYSICIAN:  Sharma Covert IV, M.D.DATE OF BIRTH:  03/10/1970  DATE OF PROCEDURE:  10/01/2015 DATE OF DISCHARGE:                              OPERATIVE REPORT   PREOPERATIVE DIAGNOSIS:  Left elbow grade IIIC terrible triad fracture dislocation.  POSTOPERATIVE DIAGNOSIS:  Left elbow grade IIIC terrible triad fracture dislocation.  ATTENDING SURGEON:  Sharma Covert, M.D., who scrubbed and present for the entire procedure.  ASSISTANT SURGEON:  None.  ANESTHESIA:  General via LMA, supraclavicular block.  SURGICAL PROCEDURE: 1. Left elbow arthrotomy and exploration and drainage for an open     fracture dislocation. 2. Left elbow, open treatment of elbow dislocation. 3. Left elbow, open treatment of radial head fracture without internal     fixation. 4. Radiographs 3 views, left elbow.  RADIOGRAPHIC INTERPRETATION:  AP, lateral, and oblique views of the elbow do show the reduced elbow joint with the radial neck fracture and a mild angulatory deformity.  SURGICAL INDICATIONS:  Mrs. Milillo is a right-hand dominant female, who sustained a horrible elbow fracture dislocation of her left elbow requiring immediate revascularization on September 29, 2015.  She was brought to the OR today to assess for the stability and possible open reconstruction and repeat irrigation and debridement.  Risks, benefits, and alternatives were discussed in detail with the patient and signed informed consent was obtained.  Risks include, but not limited to bleeding; infection; damage to nearby nerves, arteries, or tendons; loss of motion of wrist and digits; incomplete relief of symptoms; and need for further surgical intervention.  DESCRIPTION OF PROCEDURE:  The patient was properly identified in the preoperative holding area and marked with a permanent  marker made on left elbow to indicate the correct operative site.  The patient was brought back to the operating room, placed supine on the anesthesia table, general anesthesia was administered.  The patient tolerated this well.  A well-padded tourniquet placed on left brachium, sealed with 1000 drape.  The limb was then elevated.  Blood pressure cuff insufflated.  There were good palpable pulses in the radial artery prior to insufflation of tourniquet.  The lateral approach was then made to the elbow.  Dissection was carried down through the skin and subcutaneous tissues.  The fascial layer was incised and the joint was then inserted.  The patient has significant posterolateral corner injury with  _________ back the entire posterior capsule and posterior ligament structure.  The joint was then opened up.  There were grass and debris within the elbow joint.  Multiple pieces of grass were found within the elbow joint.  These were then thoroughly irrigated and removed.  Joint arthrotomy was then carried out.  Incision and drainage was then carried out.  The wound was then copiously irrigated.  Elbow dislocated in order to reduce and obtained visualization throughout the elbow joint.  There was a moderate amount of contamination within the elbow joint.  The elbow was then reduced, open treatment of the radial neck fracture was then carried out reducing the radial neck.  After open treatment of  radial neck fracture and elbow dislocation, the capsular tendinous layer was then closed with loosely reapproximated 2-0 Vicryl suture.  The skin was then closed with 3-0 Prolene. Final radiographs were obtained.  The patient was placed in a long-arm splint, extubated, and taken to recovery room in good condition.  POSTPROCEDURAL PLAN:  The patient is back for IV antibiotics and pain control.  Return back to the operating room on Friday for repeat I and D, possible stabilization of the radial neck and  head fracture, and the caution is going to be given the severity and contamination, concerned about arthroplasty and potential placement of a radial head, ORIF of the radial head given the contaminated area.  We might accept some mild angulatory deformities of the radial neck allow to heal and therefore it goes on a nonunion potentially coming back and performing the arthroplasty.  Very guarded prognosis given this current time, she is going to need continued inpatient care.     Madelynn Done, M.D.     FWO/MEDQ  D:  10/01/2015  T:  10/02/2015  Job:  [id number]

## 2015-10-03 ENCOUNTER — Inpatient Hospital Stay (HOSPITAL_COMMUNITY): Payer: BC Managed Care – PPO | Admitting: Certified Registered Nurse Anesthetist

## 2015-10-03 ENCOUNTER — Encounter (HOSPITAL_COMMUNITY)
Admission: EM | Disposition: A | Discharge status: Home / Self Care | Payer: Self-pay | Source: Home / Self Care | Attending: Orthopedic Surgery

## 2015-10-03 ENCOUNTER — Encounter (HOSPITAL_COMMUNITY): Payer: Self-pay | Admitting: Certified Registered Nurse Anesthetist

## 2015-10-03 ENCOUNTER — Telehealth: Payer: Self-pay | Admitting: Vascular Surgery

## 2015-10-03 HISTORY — PX: I & D EXTREMITY: SHX5045

## 2015-10-03 SURGERY — IRRIGATION AND DEBRIDEMENT EXTREMITY
Anesthesia: General | Site: Elbow | Laterality: Left

## 2015-10-03 MED ORDER — BUPIVACAINE HCL (PF) 0.25 % IJ SOLN
INTRAMUSCULAR | Status: AC
  Filled 2015-10-03: qty 30

## 2015-10-03 MED ORDER — ONDANSETRON HCL 4 MG/2ML IJ SOLN
INTRAMUSCULAR | Status: DC | PRN
  Administered 2015-10-03: 4 mg via INTRAVENOUS

## 2015-10-03 MED ORDER — PROMETHAZINE HCL 25 MG/ML IJ SOLN: 6.2500 mg | INTRAMUSCULAR | Status: DC | PRN

## 2015-10-03 MED ORDER — FENTANYL CITRATE (PF) 250 MCG/5ML IJ SOLN
INTRAMUSCULAR | Status: AC
  Filled 2015-10-03: qty 5

## 2015-10-03 MED ORDER — PROPOFOL 10 MG/ML IV BOLUS
INTRAVENOUS | Status: AC
  Filled 2015-10-03: qty 40

## 2015-10-03 MED ORDER — HYDROMORPHONE HCL 1 MG/ML IJ SOLN: 0.2500 mg | INTRAMUSCULAR | Status: DC | PRN

## 2015-10-03 MED ORDER — DEXAMETHASONE SODIUM PHOSPHATE 10 MG/ML IJ SOLN
INTRAMUSCULAR | Status: DC | PRN
  Administered 2015-10-03: 10 mg via INTRAVENOUS

## 2015-10-03 MED ORDER — CHLORHEXIDINE GLUCONATE 4 % EX LIQD: 60.0000 mL | Freq: Once | CUTANEOUS | Status: DC

## 2015-10-03 MED ORDER — MIDAZOLAM HCL 2 MG/2ML IJ SOLN
INTRAMUSCULAR | Status: AC
  Filled 2015-10-03: qty 2

## 2015-10-03 MED ORDER — 0.9 % SODIUM CHLORIDE (POUR BTL) OPTIME
TOPICAL | Status: DC | PRN
  Administered 2015-10-03: 1000 mL

## 2015-10-03 MED ORDER — FENTANYL CITRATE (PF) 250 MCG/5ML IJ SOLN
INTRAMUSCULAR | Status: DC | PRN
  Administered 2015-10-03 (×2): 25 ug via INTRAVENOUS
  Administered 2015-10-03: 50 ug via INTRAVENOUS

## 2015-10-03 MED ORDER — PROPOFOL 10 MG/ML IV BOLUS
INTRAVENOUS | Status: DC | PRN
  Administered 2015-10-03: 200 mg via INTRAVENOUS

## 2015-10-03 MED ORDER — SODIUM CHLORIDE 0.9 % IR SOLN
Status: DC | PRN
  Administered 2015-10-03: 3000 mL

## 2015-10-03 MED ORDER — FENTANYL CITRATE (PF) 100 MCG/2ML IJ SOLN
INTRAMUSCULAR | Status: AC
  Administered 2015-10-03: 50 ug
  Filled 2015-10-03: qty 2

## 2015-10-03 MED ORDER — HYDROCODONE-ACETAMINOPHEN 7.5-325 MG PO TABS: 1.0000 | ORAL_TABLET | Freq: Once | ORAL | Status: DC | PRN

## 2015-10-03 MED ORDER — BUPIVACAINE-EPINEPHRINE (PF) 0.5% -1:200000 IJ SOLN
INTRAMUSCULAR | Status: DC | PRN
  Administered 2015-10-03: 25 mL via PERINEURAL

## 2015-10-03 MED ORDER — SUCCINYLCHOLINE CHLORIDE 20 MG/ML IJ SOLN
INTRAMUSCULAR | Status: DC | PRN
  Administered 2015-10-03: 80 mg via INTRAVENOUS

## 2015-10-03 MED ORDER — MIDAZOLAM HCL 2 MG/2ML IJ SOLN
INTRAMUSCULAR | Status: AC
  Administered 2015-10-03: 2 mg
  Filled 2015-10-03: qty 2

## 2015-10-03 SURGICAL SUPPLY — 76 items
BANDAGE ELASTIC 3 VELCRO ST LF (GAUZE/BANDAGES/DRESSINGS) ×4 IMPLANT
BANDAGE ELASTIC 4 VELCRO ST LF (GAUZE/BANDAGES/DRESSINGS) ×7 IMPLANT
BNDG CMPR 9X4 STRL LF SNTH (GAUZE/BANDAGES/DRESSINGS) ×2
BNDG COHESIVE 1X5 TAN STRL LF (GAUZE/BANDAGES/DRESSINGS) IMPLANT
BNDG COHESIVE 4X5 TAN STRL (GAUZE/BANDAGES/DRESSINGS) ×4 IMPLANT
BNDG CONFORM 2 STRL LF (GAUZE/BANDAGES/DRESSINGS) IMPLANT
BNDG ESMARK 4X9 LF (GAUZE/BANDAGES/DRESSINGS) ×4 IMPLANT
BNDG GAUZE ELAST 4 BULKY (GAUZE/BANDAGES/DRESSINGS) ×4 IMPLANT
BRUSH SCRUB EZ PLAIN DRY (MISCELLANEOUS) ×3 IMPLANT
CORDS BIPOLAR (ELECTRODE) ×4 IMPLANT
COVER MAYO STAND STRL (DRAPES) ×4 IMPLANT
COVER SURGICAL LIGHT HANDLE (MISCELLANEOUS) ×4 IMPLANT
CUFF TOURNIQUET SINGLE 18IN (TOURNIQUET CUFF) ×4 IMPLANT
CUFF TOURNIQUET SINGLE 24IN (TOURNIQUET CUFF) IMPLANT
DRAIN PENROSE 1/4X12 LTX STRL (WOUND CARE) IMPLANT
DRAPE INCISE IOBAN 66X45 STRL (DRAPES) ×4 IMPLANT
DRAPE OEC MINIVIEW 54X84 (DRAPES) IMPLANT
DRAPE ORTHO SPLIT 77X108 STRL (DRAPES) ×8
DRAPE SURG 17X23 STRL (DRAPES) ×4 IMPLANT
DRAPE SURG ORHT 6 SPLT 77X108 (DRAPES) ×4 IMPLANT
DRAPE U-SHAPE 47X51 STRL (DRAPES) ×4 IMPLANT
DRSG ADAPTIC 3X8 NADH LF (GAUZE/BANDAGES/DRESSINGS) ×4 IMPLANT
ELECT REM PT RETURN 9FT ADLT (ELECTROSURGICAL)
ELECTRODE REM PT RTRN 9FT ADLT (ELECTROSURGICAL) IMPLANT
GAUZE SPONGE 4X4 12PLY STRL (GAUZE/BANDAGES/DRESSINGS) ×4 IMPLANT
GAUZE XEROFORM 1X8 LF (GAUZE/BANDAGES/DRESSINGS) ×4 IMPLANT
GAUZE XEROFORM 5X9 LF (GAUZE/BANDAGES/DRESSINGS) ×3 IMPLANT
GLOVE BIOGEL PI IND STRL 8.5 (GLOVE) ×2 IMPLANT
GLOVE BIOGEL PI INDICATOR 8.5 (GLOVE) ×2
GLOVE SURG ORTHO 8.0 STRL STRW (GLOVE) ×4 IMPLANT
GOWN STRL REUS W/ TWL LRG LVL3 (GOWN DISPOSABLE) ×6 IMPLANT
GOWN STRL REUS W/ TWL XL LVL3 (GOWN DISPOSABLE) ×2 IMPLANT
GOWN STRL REUS W/TWL LRG LVL3 (GOWN DISPOSABLE) ×12
GOWN STRL REUS W/TWL XL LVL3 (GOWN DISPOSABLE) ×4
HANDPIECE INTERPULSE COAX TIP (DISPOSABLE)
KIT BASIN OR (CUSTOM PROCEDURE TRAY) ×4 IMPLANT
KIT ROOM TURNOVER OR (KITS) ×4 IMPLANT
LOOP VESSEL MAXI BLUE (MISCELLANEOUS) IMPLANT
MANIFOLD NEPTUNE II (INSTRUMENTS) ×4 IMPLANT
NDL HYPO 25GX1X1/2 BEV (NEEDLE) IMPLANT
NEEDLE HYPO 25GX1X1/2 BEV (NEEDLE) IMPLANT
NS IRRIG 1000ML POUR BTL (IV SOLUTION) ×4 IMPLANT
PACK ORTHO EXTREMITY (CUSTOM PROCEDURE TRAY) ×4 IMPLANT
PAD ARMBOARD 7.5X6 YLW CONV (MISCELLANEOUS) ×8 IMPLANT
PAD CAST 4YDX4 CTTN HI CHSV (CAST SUPPLIES) ×2 IMPLANT
PADDING CAST ABS 3INX4YD NS (CAST SUPPLIES) ×4
PADDING CAST ABS 4INX4YD NS (CAST SUPPLIES) ×2
PADDING CAST ABS COTTON 3X4 (CAST SUPPLIES) ×2 IMPLANT
PADDING CAST ABS COTTON 4X4 ST (CAST SUPPLIES) ×1 IMPLANT
PADDING CAST COTTON 4X4 STRL (CAST SUPPLIES) ×4
SET HNDPC FAN SPRY TIP SCT (DISPOSABLE) IMPLANT
SOAP 2 % CHG 4 OZ (WOUND CARE) ×4 IMPLANT
SPLINT FIBERGLASS 3X35 (CAST SUPPLIES) ×3 IMPLANT
SPONGE GAUZE 4X4 12PLY STER LF (GAUZE/BANDAGES/DRESSINGS) ×3 IMPLANT
SPONGE LAP 18X18 X RAY DECT (DISPOSABLE) ×4 IMPLANT
SPONGE LAP 4X18 X RAY DECT (DISPOSABLE) ×4 IMPLANT
SUCTION FRAZIER HANDLE 10FR (MISCELLANEOUS) ×2
SUCTION TUBE FRAZIER 10FR DISP (MISCELLANEOUS) ×2 IMPLANT
SUT ETHILON 3 0 PS 1 (SUTURE) ×6 IMPLANT
SUT ETHILON 4 0 PS 2 18 (SUTURE) IMPLANT
SUT ETHILON 5 0 P 3 18 (SUTURE)
SUT MERSILENE 4 0 P 3 (SUTURE) IMPLANT
SUT NYLON ETHILON 5-0 P-3 1X18 (SUTURE) IMPLANT
SUT PROLENE 4 0 PS 2 18 (SUTURE) IMPLANT
SUT VIC AB 2-0 CT1 27 (SUTURE)
SUT VIC AB 2-0 CT1 36 (SUTURE) ×3 IMPLANT
SUT VIC AB 2-0 CT1 TAPERPNT 27 (SUTURE) IMPLANT
SYR CONTROL 10ML LL (SYRINGE) IMPLANT
TOWEL OR 17X24 6PK STRL BLUE (TOWEL DISPOSABLE) ×4 IMPLANT
TOWEL OR 17X26 10 PK STRL BLUE (TOWEL DISPOSABLE) ×8 IMPLANT
TUBE ANAEROBIC SPECIMEN COL (MISCELLANEOUS) IMPLANT
TUBE CONNECTING 12'X1/4 (SUCTIONS) ×1
TUBE CONNECTING 12X1/4 (SUCTIONS) ×3 IMPLANT
UNDERPAD 30X30 INCONTINENT (UNDERPADS AND DIAPERS) ×4 IMPLANT
WATER STERILE IRR 1000ML POUR (IV SOLUTION) ×4 IMPLANT
YANKAUER SUCT BULB TIP NO VENT (SUCTIONS) ×4 IMPLANT

## 2015-10-03 NOTE — Progress Notes (Signed)
R/B/A DISCUSSED WITH PT IN HOSPITAL.  PT VOICED UNDERSTANDING OF PLAN CONSENT SIGNED DAY OF SURGERY PT SEEN AND EXAMINED PRIOR TO OPERATIVE PROCEDURE/DAY OF SURGERY SITE MARKED. QUESTIONS ANSWERED WILL REMAIN AN INPATIENT FOLLOWING SURGERY PLAN TODAY IS FOR REPEAT INCISION AND IRRIGATION AND DEBRIDEMENT AND POSSIBLE STABILIZATION OF RADIAL HEAD. WILL SEE HOW JOINT LOOKS BEFORE DEFINITIVE RADIAL HEAD FIXATION/ARTHROPLASTY PT VOICED UNDERSTANDING AND CONSENT SIGNED

## 2015-10-03 NOTE — Telephone Encounter (Addendum)
-----   Message from Sharee Pimple, RN sent at 10/02/2015 10:23 AM EDT ----- Regarding: Schedule 2 week post op   ----- Message ----- From: Lars Mage, PA-C Sent: 10/02/2015   9:13 AM To: [facility] Charge Pool  F/U with Dr. Arbie Cookey in 2 weeks s/p brachial artery by pass traumatic injury.   Scheduled an appt for pt on 10/15/15 at 1:30pm. I mailed an appt letter to the patient and also left a voicemail for her TK:PTWS. Krystal Clark

## 2015-10-03 NOTE — Progress Notes (Signed)
CRNA at bedside preparing to take pt to OR. 

## 2015-10-03 NOTE — Brief Op Note (Signed)
09/29/2015 - 10/03/2015  4:11 PM  PATIENT:  Tracy Wade  45 y.o. female  PRE-OPERATIVE DIAGNOSIS:  Left elbow fracture  POST-OPERATIVE DIAGNOSIS:  * No post-op diagnosis entered *  PROCEDURE:  Procedure(s): OPEN REDUCTION INTERNAL FIXATION (ORIF) ELBOW/OLECRANON FRACTURE (Left) IRRIGATION AND DEBRIDEMENT ELBOW (Left)  SURGEON:  Surgeon(s) and Role:    * Bradly Bienenstock, MD - Primary  PHYSICIAN ASSISTANT:   ASSISTANTS: none   ANESTHESIA:   general  EBL:  Total I/O In: 1048.7 [I.V.:848.7; IV Piggyback:200] Out: 900 [Urine:900]  BLOOD ADMINISTERED:none  DRAINS: none   LOCAL MEDICATIONS USED:  NONE  SPECIMEN:  No Specimen  DISPOSITION OF SPECIMEN:  N/A  COUNTS:  YES  TOURNIQUET:    DICTATION: .Other Dictation: Dictation Number [id number]  PLAN OF CARE: Admit to inpatient   PATIENT DISPOSITION:  PACU - hemodynamically stable.   Delay start of Pharmacological VTE agent (>24hrs) due to surgical blood loss or risk of bleeding: not applicable

## 2015-10-03 NOTE — Anesthesia Procedure Notes (Addendum)
Anesthesia Regional Block:  Supraclavicular block  Pre-Anesthetic Checklist: ,, timeout performed, Correct Patient, Correct Site, Correct Laterality, Correct Procedure, Correct Position, site marked, Risks and benefits discussed,  Surgical consent,  Pre-op evaluation,  At surgeon's request and post-op pain management  Laterality: Left  Prep: chloraprep       Needles:  Injection technique: Single-shot  Needle Type: Echogenic Stimulator Needle     Needle Length: 9cm 9 cm Needle Gauge: 21 G    Additional Needles:  Procedures: ultrasound guided (picture in chart) and nerve stimulator Supraclavicular block  Nerve Stimulator or Paresthesia:  Response: bicep and wrist extension., 0.6 mA,   Additional Responses:   Narrative:  Start time: 10/03/2015 4:00 PM End time: 10/03/2015 4:08 PM Injection made incrementally with aspirations every 5 mL.  Events: injection painful  Performed by: Personally  Anesthesiologist: Marcene Duos  Additional Notes: Risks, benefits and alternative to block explained extensively.  Patient tolerated procedure well, without complications.

## 2015-10-03 NOTE — Anesthesia Preprocedure Evaluation (Signed)
Anesthesia Evaluation  Patient identified by MRN, date of birth, ID band Patient awake    Reviewed: Allergy & Precautions, NPO status , Patient's Chart, lab work & pertinent test results  Airway Mallampati: II  TM Distance: >3 FB Neck ROM: Full    Dental  (+) Dental Advisory Given, Teeth Intact   Pulmonary neg pulmonary ROS,    breath sounds clear to auscultation       Cardiovascular hypertension, + Peripheral Vascular Disease   Rhythm:Regular Rate:Normal     Neuro/Psych PSYCHIATRIC DISORDERS Anxiety negative neurological ROS     GI/Hepatic Neg liver ROS, GERD  Medicated,  Endo/Other  negative endocrine ROS  Renal/GU negative Renal ROS  negative genitourinary   Musculoskeletal negative musculoskeletal ROS (+)   Abdominal   Peds negative pediatric ROS (+)  Hematology negative hematology ROS (+)   Anesthesia Other Findings   Reproductive/Obstetrics negative OB ROS                            Lab Results  Component Value Date   WBC 10.7 (H) 09/30/2015   HGB 12.6 09/30/2015   HCT 37.6 09/30/2015   MCV 90.8 09/30/2015   PLT 219 09/30/2015   Lab Results  Component Value Date   CREATININE 0.83 09/30/2015   BUN 17 09/29/2015   NA 139 09/29/2015   K 3.4 (L) 09/29/2015   CL 105 09/29/2015   CO2 20 (L) 09/29/2015   Lab Results  Component Value Date   INR 0.94 09/29/2015    Anesthesia Physical Anesthesia Plan  ASA: II  Anesthesia Plan: General   Post-op Pain Management: GA combined w/ Regional for post-op pain   Induction: Intravenous  Airway Management Planned: LMA  Additional Equipment:   Intra-op Plan:   Post-operative Plan: Extubation in OR  Informed Consent: I have reviewed the patients History and Physical, chart, labs and discussed the procedure including the risks, benefits and alternatives for the proposed anesthesia with the patient or authorized representative  who has indicated his/her understanding and acceptance.     Plan Discussed with: CRNA  Anesthesia Plan Comments:        Anesthesia Quick Evaluation  

## 2015-10-03 NOTE — Transfer of Care (Signed)
Immediate Anesthesia Transfer of Care Note  Patient: Ruby A Zill  Procedure(s) Performed: Procedure(s): IRRIGATION AND DEBRIDEMENT LEFT ELBOW (Left)  Patient Location: PACU  Anesthesia Type:General  Level of Consciousness: sedated, patient cooperative and responds to stimulation  Airway & Oxygen Therapy: Patient Spontanous Breathing and Patient connected to nasal cannula oxygen  Post-op Assessment: Report given to RN and Post -op Vital signs reviewed and stable  Post vital signs: Reviewed and stable  Last Vitals:  Vitals:   10/03/15 1620 10/03/15 1800  BP: 133/70   Pulse: (!) 123   Resp: (!) 25   Temp:  (P) 36.9 C    Last Pain:  Vitals:   10/03/15 1444  TempSrc: Oral  PainSc:       Patients Stated Pain Goal: 0 (10/02/15 1000)  Complications: No apparent anesthesia complications

## 2015-10-03 NOTE — Anesthesia Procedure Notes (Signed)
Procedure Name: LMA Insertion Date/Time: 10/03/2015 5:02 PM Performed by: Daiva Eves Pre-anesthesia Checklist: Patient identified, Emergency Drugs available, Patient being monitored, Timeout performed and Suction available Patient Re-evaluated:Patient Re-evaluated prior to inductionOxygen Delivery Method: Circle system utilized Preoxygenation: Pre-oxygenation with 100% oxygen Intubation Type: IV induction Ventilation: Mask ventilation without difficulty LMA: LMA inserted LMA Size: 4.0 Number of attempts: 1 Placement Confirmation: CO2 detector,  breath sounds checked- equal and bilateral and positive ETCO2 Tube secured with: Tape Dental Injury: Teeth and Oropharynx as per pre-operative assessment

## 2015-10-04 LAB — BASIC METABOLIC PANEL
Anion gap: 8 (ref 5–15)
BUN: 11 mg/dL (ref 6–20)
CHLORIDE: 101 mmol/L (ref 101–111)
CO2: 25 mmol/L (ref 22–32)
CREATININE: 0.87 mg/dL (ref 0.44–1.00)
Calcium: 8.6 mg/dL — ABNORMAL LOW (ref 8.9–10.3)
GFR calc non Af Amer: >60 mL/min (ref >60)
Glucose, Bld: 177 mg/dL — ABNORMAL HIGH (ref 65–99)
Potassium: 3.9 mmol/L (ref 3.5–5.1)
Sodium: 134 mmol/L — ABNORMAL LOW (ref 135–145)

## 2015-10-04 MED ORDER — ZOLPIDEM TARTRATE 5 MG PO TABS
5.0000 mg | ORAL_TABLET | Freq: Once | ORAL | Status: AC
  Administered 2015-10-04: 5 mg via ORAL
  Filled 2015-10-04: qty 1

## 2015-10-04 NOTE — Op Note (Signed)
NAMEDARICE, VICARIO NO.:  [id number]  MEDICAL RECORD NO.:  [id number]  LOCATION:  6N20C                        FACILITY:  [facility]  PHYSICIAN:  Sharma Covert IV, M.D.DATE OF BIRTH:  [birth year]  DATE OF PROCEDURE:  10/03/2015 DATE OF DISCHARGE:                              OPERATIVE REPORT   PREOPERATIVE DIAGNOSIS:  Left elbow open fracture dislocation.  POSTOPERATIVE DIAGNOSIS:  Left elbow open fracture dislocation.  ATTENDING PHYSICIAN:  Sharma Covert, M.D., who scrubbed and present for the entire procedure.  ASSISTANT SURGEON:  None.  ANESTHESIA:  General via LMA with supraclavicular block.  PROCEDURES PERFORMED: 1. Open treatment of left elbow dislocation, not internal fixation. 2. Left elbow arthrotomy, exploration, and drainage.  SURGICAL INDICATIONS:  Mrs. Theall is a 45 year old right-hand-dominant female, who sustained an open fracture dislocation.  The patient was brought back to the operating room for a scheduled procedure.  Risks, benefits, and alternatives were discussed in detail with the patient and signed informed consent was obtained.  Risks include, but not limited to bleeding; infection; damage to nearby nerves, arteries, or tendons; loss of motion of wrist and digits; incomplete relief of symptoms; and need for further surgical intervention.  DESCRIPTION OF PROCEDURE:  The patient was properly identified in the preoperative holding area, marked with a permanent marker made on left elbow to indicate the correct operative site.  The patient underwent the block.  The patient was brought back to the operating room, placed supine on the anesthesia table.  General anesthesia was administered. The patient tolerated this well.  A well-padded tourniquet was placed on left brachium and sealed with 1000 drape.  Left upper extremity was then prepped and draped in normal sterile fashion.  Time-out was called, the correct side was identified,  and the procedure begun.  Attention then turned to left elbow where the previous incision was then opened up. The arthrotomy was then carried out after tourniquet inflated.  The Esmarch was not used.  There joint arthrotomy was then carried out and open treatment of the elbow dislocation was then carried out.  Once this was carried out, the radiocapitellar joint looked pretty good.  The radial head looked good.  The patient did have some mild debris still within the elbow, and this was thoroughly irrigated out with pulsatile lavage.  Arthrotomy drain was then carried out of the elbow.  Based on the small amount of contamination within the elbow joint, decided not to do any type of internal fixation of the elbow.  The capsule layer was then closed with Vicryl suture.  The skin was then closed with Prolene, and the patient was then placed in the Xeroform dressing and sterile compressive bandage applied.  The patient was placed in a well-padded long-arm splint, extubated, and taken to recovery room in good condition.  POSTPROCEDURAL PLAN:  The patient will be admitted for IV antibiotics and pain control.  She will return back to the operating room in 2 days for definitive fixation of the lateral ulnar collateral ligament complex and likely try to relieve the radial head alone as it looked relatively good in good position tonight.  We try to treat this in a closed fashion  without putting any metal potential foreign material within the elbow joint itself.     Madelynn Done, M.D.     FWO/MEDQ  D:  10/03/2015  T:  10/04/2015  Job:  [id number]

## 2015-10-04 NOTE — Progress Notes (Signed)
   Subjective: 1 Day Post-Op Procedure(s) (LRB): IRRIGATION AND DEBRIDEMENT LEFT ELBOW (Left) Patient reports pain as moderate.   Patient seen in rounds for Dr. Melvyn Novas. Patient drowsy today from anesthesia and likely the PCA. Patient is well, but has had some minor complaints of pain in the left arm, requiring pain medications Possible plan for taking back to the operating room for a repeat washout and fixation tomorrow. Encouraged ROM of fingers.  Objective: Vital signs in last 24 hours: Temp:  [97.8 F (36.6 C)-99.6 F (37.6 C)] 98.6 F (37 C) (07/29 0522) Pulse Rate:  [74-123] 86 (07/29 0522) Resp:  [14-25] 15 (07/29 0828) BP: (107-138)/(70-91) 130/86 (07/29 0522) SpO2:  [98 %-100 %] 100 % (07/29 0828) FiO2 (%):  [100 %] 100 % (07/29 0400)  Intake/Output from previous day: 07/28 0701 - 07/29 0700 In: 1929.3 [P.O.:60; I.V.:1669.3; IV Piggyback:200] Out: 2815 [Urine:2800; Blood:15] Intake/Output this shift: No intake/output data recorded.  No results for input(s): HGB in the last 72 hours. No results for input(s): WBC, RBC, HCT, PLT in the last 72 hours.  Recent Labs  10/04/15 0445  NA 134*  K 3.9  CL 101  CO2 25  BUN 11  CREATININE 0.87  GLUCOSE 177*  CALCIUM 8.6*   No results for input(s): LABPT, INR in the last 72 hours.  EXAM General - Patient is Alert, Oriented and but a little sedated on exam during rounds Extremity - Neurovascular intact Sensation intact distally and moving fingers and thumb slowly on exam Dressing - dressing C/D/I Motor Function - intact, moving fingers slowly and extending thumb. Weakness noted with wrist dorsiflexion but could be due to painful movement.  Past Medical History:  Diagnosis Date  . Fall from horse 09/29/2015   injury to brachial artery  . GERD (gastroesophageal reflux disease)   . Hypertension     Assessment/Plan: 1 Day Post-Op Procedure(s) (LRB): IRRIGATION AND DEBRIDEMENT LEFT ELBOW (Left) Active  Problems:   Open fracture dislocation of left elbow joint   Injury of left brachial artery   Fall from horse  Estimated body mass index is 27.61 kg/m as calculated from the following:   Height as of this encounter: 5\' 3"  (1.6 m).   Weight as of this encounter: 70.7 kg (155 lb 13.8 oz). IV ABX Plan for surgery tomorrow NPO after MN  Avel Peace, PA-C Orthopaedic Surgery 10/04/2015, 10:04 AM

## 2015-10-04 NOTE — Anesthesia Postprocedure Evaluation (Signed)
Anesthesia Post Note  Patient: Tracy Wade  Procedure(s) Performed: Procedure(s) (LRB): IRRIGATION AND DEBRIDEMENT LEFT ELBOW (Left)  Patient location during evaluation: PACU Anesthesia Type: General Level of consciousness: awake and alert Pain management: pain level controlled Vital Signs Assessment: post-procedure vital signs reviewed and stable Respiratory status: spontaneous breathing, nonlabored ventilation, respiratory function stable and patient connected to nasal cannula oxygen Cardiovascular status: blood pressure returned to baseline and stable Postop Assessment: no signs of nausea or vomiting Anesthetic complications: no    Last Vitals:  Vitals:   10/04/15 0522 10/04/15 0828  BP: 130/86   Pulse: 86   Resp: 17 15  Temp: 37 C     Last Pain:  Vitals:   10/04/15 0828  TempSrc:   PainSc: 2                  Reino Kent

## 2015-10-05 ENCOUNTER — Inpatient Hospital Stay (HOSPITAL_COMMUNITY): Payer: BC Managed Care – PPO | Admitting: Certified Registered"

## 2015-10-05 ENCOUNTER — Encounter (HOSPITAL_COMMUNITY)
Admission: EM | Disposition: A | Discharge status: Home / Self Care | Payer: Self-pay | Source: Home / Self Care | Attending: Orthopedic Surgery

## 2015-10-05 HISTORY — PX: I & D EXTREMITY: SHX5045

## 2015-10-05 SURGERY — IRRIGATION AND DEBRIDEMENT EXTREMITY
Anesthesia: Regional | Site: Arm Upper | Laterality: Left

## 2015-10-05 MED ORDER — MIDAZOLAM HCL 2 MG/2ML IJ SOLN
INTRAMUSCULAR | Status: AC
  Filled 2015-10-05: qty 2

## 2015-10-05 MED ORDER — SODIUM CHLORIDE 0.9 % IR SOLN
Status: DC | PRN
  Administered 2015-10-05: 1

## 2015-10-05 MED ORDER — PROPOFOL 10 MG/ML IV BOLUS
INTRAVENOUS | Status: DC | PRN
  Administered 2015-10-05: 200 mg via INTRAVENOUS

## 2015-10-05 MED ORDER — ROCURONIUM BROMIDE 50 MG/5ML IV SOLN
INTRAVENOUS | Status: AC
  Filled 2015-10-05: qty 1

## 2015-10-05 MED ORDER — HYDROMORPHONE HCL 1 MG/ML IJ SOLN
0.5000 mg | INTRAMUSCULAR | Status: DC | PRN
  Administered 2015-10-05 – 2015-10-06 (×3): 1 mg via INTRAVENOUS
  Filled 2015-10-05 (×3): qty 1

## 2015-10-05 MED ORDER — DIPHENHYDRAMINE HCL 25 MG PO CAPS: 25.0000 mg | ORAL_CAPSULE | Freq: Four times a day (QID) | ORAL | Status: DC | PRN

## 2015-10-05 MED ORDER — BUPIVACAINE-EPINEPHRINE (PF) 0.5% -1:200000 IJ SOLN
INTRAMUSCULAR | Status: DC | PRN
  Administered 2015-10-05: 30 mL via PERINEURAL

## 2015-10-05 MED ORDER — CHLORHEXIDINE GLUCONATE 4 % EX LIQD: 60.0000 mL | Freq: Once | CUTANEOUS | Status: DC

## 2015-10-05 MED ORDER — LIDOCAINE HCL (CARDIAC) 20 MG/ML IV SOLN
INTRAVENOUS | Status: DC | PRN
  Administered 2015-10-05: 80 mg via INTRAVENOUS

## 2015-10-05 MED ORDER — FENTANYL CITRATE (PF) 100 MCG/2ML IJ SOLN
INTRAMUSCULAR | Status: DC | PRN
  Administered 2015-10-05: 50 ug via INTRAVENOUS
  Administered 2015-10-05: 100 ug via INTRAVENOUS

## 2015-10-05 MED ORDER — HYDROMORPHONE HCL 1 MG/ML IJ SOLN: 0.2500 mg | INTRAMUSCULAR | Status: DC | PRN

## 2015-10-05 MED ORDER — MIDAZOLAM HCL 5 MG/5ML IJ SOLN
INTRAMUSCULAR | Status: DC | PRN
  Administered 2015-10-05: 2 mg via INTRAVENOUS

## 2015-10-05 MED ORDER — FENTANYL CITRATE (PF) 250 MCG/5ML IJ SOLN
INTRAMUSCULAR | Status: AC
  Filled 2015-10-05: qty 5

## 2015-10-05 MED ORDER — VITAMIN C 500 MG PO TABS
1000.0000 mg | ORAL_TABLET | Freq: Every day | ORAL | Status: DC
  Administered 2015-10-06 – 2015-10-07 (×2): 1000 mg via ORAL
  Filled 2015-10-05 (×3): qty 2

## 2015-10-05 MED ORDER — DEXAMETHASONE SODIUM PHOSPHATE 10 MG/ML IJ SOLN
INTRAMUSCULAR | Status: DC | PRN
  Administered 2015-10-05: 6 mg via INTRAVENOUS

## 2015-10-05 MED ORDER — PROPOFOL 10 MG/ML IV BOLUS
INTRAVENOUS | Status: AC
  Filled 2015-10-05: qty 20

## 2015-10-05 MED ORDER — ADULT MULTIVITAMIN W/MINERALS CH
1.0000 | ORAL_TABLET | Freq: Every day | ORAL | Status: DC
  Filled 2015-10-05 (×3): qty 1

## 2015-10-05 MED ORDER — LABETALOL HCL 5 MG/ML IV SOLN
5.0000 mg | INTRAVENOUS | Status: AC | PRN
  Administered 2015-10-05 (×4): 5 mg via INTRAVENOUS

## 2015-10-05 MED ORDER — OXYCODONE-ACETAMINOPHEN 5-325 MG PO TABS
1.0000 | ORAL_TABLET | ORAL | Status: DC | PRN
  Administered 2015-10-05 – 2015-10-07 (×2): 1 via ORAL
  Filled 2015-10-05 (×2): qty 1

## 2015-10-05 MED ORDER — LABETALOL HCL 5 MG/ML IV SOLN
INTRAVENOUS | Status: AC
  Administered 2015-10-05: 5 mg via INTRAVENOUS
  Filled 2015-10-05: qty 4

## 2015-10-05 MED ORDER — HYDROCODONE-ACETAMINOPHEN 7.5-325 MG PO TABS
1.0000 | ORAL_TABLET | ORAL | Status: DC | PRN
  Administered 2015-10-05 – 2015-10-06 (×3): 1 via ORAL
  Administered 2015-10-06 – 2015-10-07 (×4): 2 via ORAL
  Filled 2015-10-05 (×4): qty 2
  Filled 2015-10-05: qty 1
  Filled 2015-10-05: qty 2

## 2015-10-05 MED ORDER — METHOCARBAMOL 1000 MG/10ML IJ SOLN
500.0000 mg | Freq: Four times a day (QID) | INTRAMUSCULAR | Status: DC | PRN
  Filled 2015-10-05: qty 5

## 2015-10-05 MED ORDER — METHOCARBAMOL 500 MG PO TABS
500.0000 mg | ORAL_TABLET | Freq: Four times a day (QID) | ORAL | Status: DC | PRN
  Administered 2015-10-05 – 2015-10-07 (×2): 500 mg via ORAL
  Filled 2015-10-05 (×2): qty 1

## 2015-10-05 MED ORDER — PHENYLEPHRINE 40 MCG/ML (10ML) SYRINGE FOR IV PUSH (FOR BLOOD PRESSURE SUPPORT)
PREFILLED_SYRINGE | INTRAVENOUS | Status: AC
  Filled 2015-10-05: qty 10

## 2015-10-05 SURGICAL SUPPLY — 67 items
ANCH SUT 2 2.9 2 LD TPR NDL (Anchor) ×2 IMPLANT
ANCHOR JUGGERKNOT WTAP NDL 2.9 (Anchor) ×2 IMPLANT
BANDAGE ELASTIC 3 VELCRO ST LF (GAUZE/BANDAGES/DRESSINGS) ×2 IMPLANT
BANDAGE ELASTIC 4 VELCRO ST LF (GAUZE/BANDAGES/DRESSINGS) ×2 IMPLANT
BANDAGE ELASTIC 6 VELCRO ST LF (GAUZE/BANDAGES/DRESSINGS) ×1 IMPLANT
BIT DRILL JUGRKNT W/NDL BIT2.9 (DRILL) IMPLANT
BNDG CMPR 9X4 STRL LF SNTH (GAUZE/BANDAGES/DRESSINGS) ×1
BNDG COHESIVE 1X5 TAN STRL LF (GAUZE/BANDAGES/DRESSINGS) ×1 IMPLANT
BNDG CONFORM 2 STRL LF (GAUZE/BANDAGES/DRESSINGS) ×1 IMPLANT
BNDG ESMARK 4X9 LF (GAUZE/BANDAGES/DRESSINGS) ×2 IMPLANT
BNDG GAUZE ELAST 4 BULKY (GAUZE/BANDAGES/DRESSINGS) ×2 IMPLANT
CORDS BIPOLAR (ELECTRODE) ×2 IMPLANT
COVER SURGICAL LIGHT HANDLE (MISCELLANEOUS) ×2 IMPLANT
CUFF TOURNIQUET SINGLE 18IN (TOURNIQUET CUFF) ×2 IMPLANT
CUFF TOURNIQUET SINGLE 24IN (TOURNIQUET CUFF) IMPLANT
DRAIN PENROSE 1/4X12 LTX STRL (WOUND CARE) IMPLANT
DRAPE C-ARM 42X72 X-RAY (DRAPES) ×1 IMPLANT
DRAPE SURG 17X23 STRL (DRAPES) ×2 IMPLANT
DRILL JUGGERKNOT W/NDL BIT 2.9 (DRILL) ×2
DRSG ADAPTIC 3X8 NADH LF (GAUZE/BANDAGES/DRESSINGS) ×2 IMPLANT
ELECT REM PT RETURN 9FT ADLT (ELECTROSURGICAL)
ELECTRODE REM PT RTRN 9FT ADLT (ELECTROSURGICAL) IMPLANT
GAUZE SPONGE 4X4 12PLY STRL (GAUZE/BANDAGES/DRESSINGS) ×2 IMPLANT
GAUZE XEROFORM 1X8 LF (GAUZE/BANDAGES/DRESSINGS) ×2 IMPLANT
GAUZE XEROFORM 5X9 LF (GAUZE/BANDAGES/DRESSINGS) ×1 IMPLANT
GLOVE BIOGEL PI IND STRL 8.5 (GLOVE) ×1 IMPLANT
GLOVE BIOGEL PI INDICATOR 8.5 (GLOVE) ×1
GLOVE SURG ORTHO 8.0 STRL STRW (GLOVE) ×2 IMPLANT
GOWN STRL REUS W/ TWL LRG LVL3 (GOWN DISPOSABLE) ×3 IMPLANT
GOWN STRL REUS W/ TWL XL LVL3 (GOWN DISPOSABLE) ×1 IMPLANT
GOWN STRL REUS W/TWL LRG LVL3 (GOWN DISPOSABLE) ×6
GOWN STRL REUS W/TWL XL LVL3 (GOWN DISPOSABLE) ×2
HANDPIECE INTERPULSE COAX TIP (DISPOSABLE)
KIT BASIN OR (CUSTOM PROCEDURE TRAY) ×2 IMPLANT
KIT ROOM TURNOVER OR (KITS) ×2 IMPLANT
MANIFOLD NEPTUNE II (INSTRUMENTS) ×2 IMPLANT
NDL HYPO 25GX1X1/2 BEV (NEEDLE) IMPLANT
NEEDLE HYPO 25GX1X1/2 BEV (NEEDLE) IMPLANT
NS IRRIG 1000ML POUR BTL (IV SOLUTION) ×2 IMPLANT
PACK ORTHO EXTREMITY (CUSTOM PROCEDURE TRAY) ×2 IMPLANT
PAD ARMBOARD 7.5X6 YLW CONV (MISCELLANEOUS) ×4 IMPLANT
PAD CAST 3X4 CTTN HI CHSV (CAST SUPPLIES) IMPLANT
PAD CAST 4YDX4 CTTN HI CHSV (CAST SUPPLIES) ×1 IMPLANT
PADDING CAST COTTON 3X4 STRL (CAST SUPPLIES) ×2
PADDING CAST COTTON 4X4 STRL (CAST SUPPLIES) ×2
SET HNDPC FAN SPRY TIP SCT (DISPOSABLE) IMPLANT
SOAP 2 % CHG 4 OZ (WOUND CARE) ×2 IMPLANT
SPLINT FIBERGLASS 3X35 (CAST SUPPLIES) ×1 IMPLANT
SPONGE LAP 18X18 X RAY DECT (DISPOSABLE) ×2 IMPLANT
SPONGE LAP 4X18 X RAY DECT (DISPOSABLE) ×2 IMPLANT
SUCTION FRAZIER HANDLE 10FR (MISCELLANEOUS) ×1
SUCTION TUBE FRAZIER 10FR DISP (MISCELLANEOUS) ×1 IMPLANT
SUT ETHILON 4 0 PS 2 18 (SUTURE) IMPLANT
SUT ETHILON 5 0 P 3 18 (SUTURE)
SUT NYLON ETHILON 5-0 P-3 1X18 (SUTURE) IMPLANT
SUT PROLENE 3 0 PS 2 (SUTURE) ×2 IMPLANT
SUT VIC AB 2-0 CT1 27 (SUTURE) ×2
SUT VIC AB 2-0 CT1 TAPERPNT 27 (SUTURE) IMPLANT
SUT VIC AB 4-0 PS2 27 (SUTURE) ×1 IMPLANT
SYR CONTROL 10ML LL (SYRINGE) IMPLANT
TOWEL OR 17X24 6PK STRL BLUE (TOWEL DISPOSABLE) ×2 IMPLANT
TOWEL OR 17X26 10 PK STRL BLUE (TOWEL DISPOSABLE) ×2 IMPLANT
TUBE ANAEROBIC SPECIMEN COL (MISCELLANEOUS) IMPLANT
TUBE CONNECTING 12X1/4 (SUCTIONS) ×2 IMPLANT
UNDERPAD 30X30 INCONTINENT (UNDERPADS AND DIAPERS) ×2 IMPLANT
WATER STERILE IRR 1000ML POUR (IV SOLUTION) ×2 IMPLANT
YANKAUER SUCT BULB TIP NO VENT (SUCTIONS) ×2 IMPLANT

## 2015-10-05 NOTE — Brief Op Note (Signed)
09/29/2015 - 10/05/2015  7:44 AM  PATIENT:  Tracy Wade  45 y.o. female  PRE-OPERATIVE DIAGNOSIS:  left elbow fx  POST-OPERATIVE DIAGNOSIS:  * No post-op diagnosis entered *  PROCEDURE:  Procedure(s): IRRIGATION AND DEBRIDEMENT ELBOW (Left)  SURGEON:  Surgeon(s) and Role:    * Bradly Bienenstock, MD - Primary  PHYSICIAN ASSISTANT:   ASSISTANTS: none   ANESTHESIA:   general  EBL:  No intake/output data recorded.  BLOOD ADMINISTERED:none  DRAINS: none   LOCAL MEDICATIONS USED:  NONE  SPECIMEN:  No Specimen  DISPOSITION OF SPECIMEN:  N/A  COUNTS:  YES  TOURNIQUET:    DICTATION: .Other Dictation: Dictation Number [id number]  PLAN OF CARE: Admit to inpatient   PATIENT DISPOSITION:  PACU - hemodynamically stable.   Delay start of Pharmacological VTE agent (>24hrs) due to surgical blood loss or risk of bleeding: not applicable

## 2015-10-05 NOTE — Anesthesia Procedure Notes (Signed)
Procedure Name: LMA Insertion Date/Time: 10/05/2015 8:11 AM Performed by: Dorie Rank Pre-anesthesia Checklist: Patient identified, Emergency Drugs available, Patient being monitored, Suction available and Timeout performed Patient Re-evaluated:Patient Re-evaluated prior to inductionOxygen Delivery Method: Circle system utilized Preoxygenation: Pre-oxygenation with 100% oxygen Intubation Type: IV induction Ventilation: Mask ventilation without difficulty LMA: LMA inserted LMA Size: 4.0 Number of attempts: 1 Placement Confirmation: positive ETCO2 and breath sounds checked- equal and bilateral Tube secured with: Tape Dental Injury: Teeth and Oropharynx as per pre-operative assessment

## 2015-10-05 NOTE — Progress Notes (Signed)
Pt received back from PACU into 6N30.   Family at bedside.  Pt anxious from the hallucinations she had last night from a medication.  BP elevated. Will retake.

## 2015-10-05 NOTE — Progress Notes (Signed)
PT SEEN/EXAMINED PT HERE FOR REPEAT IRRIGATION AND DEBRIDEMENT AND POSSIBLE REPAIR, ORIF/ARTHROPLASTY OF LEFT RADIAL HEAD PT COUNSELED ABOUT PROCEDURE HUSBAND AND FAMILY PRESENT FOR DISCUSSION PT UNDERSTANDS REASON FOR SURGERY AND SIGNED CONSENT OBTAINED WE ARE PLANNING SURGERY FOR YOUR UPPER EXTREMITY. THE RISKS AND BENEFITS OF SURGERY INCLUDE BUT NOT LIMITED TO BLEEDING INFECTION, DAMAGE TO NEARBY NERVES ARTERIES TENDONS, FAILURE OF SURGERY TO ACCOMPLISH ITS INTENDED GOALS, PERSISTENT SYMPTOMS AND NEED FOR FURTHER SURGICAL INTERVENTION. WITH THIS IN MIND WE WILL PROCEED. I HAVE DISCUSSED WITH THE PATIENT THE PRE AND POSTOPERATIVE REGIMEN AND THE DOS AND DON'TS. PT VOICED UNDERSTANDING AND INFORMED CONSENT SIGNED.

## 2015-10-05 NOTE — Progress Notes (Signed)
Pharmacy Antibiotic Note  Tracy Wade is a 45 y.o. female admitted on 09/29/2015 with open L elbow fx.  Pharmacy consulted for gentamicin dosing.  She has been on ancef/gent/PCN per open fracture guidelines since 4/24 PM.  Her MRSA PCR is neg.  WBC 16.2>10.7, afebrile. Scr 0.87 on 7/29 She has had I&D of L elbow x 3, with a 4th I&D today, ABW 60 kg.  Plan:  continue gent 420 mg (7 mg/kg ABW) Ancef 1 gm q8 & PCN 2 MU q4h per open fracture guidelines q72 hr BMET for renal fxn check due Tues 8/1 - not yet ordered  Height: 5\' 3"  (160 cm) Weight: 155 lb 13.8 oz (70.7 kg) IBW/kg (Calculated) : 52.4  Temp (24hrs), Avg:98.2 F (36.8 C), Min:97.4 F (36.3 C), Max:98.5 F (36.9 C)   Recent Labs Lab 09/29/15 2143 09/29/15 2158 09/30/15 0418 10/04/15 0445  WBC 16.2*  --  10.7*  --   CREATININE 0.96 0.80 0.83 0.87  LATICACIDVEN  --  4.55*  --   --     Estimated Creatinine Clearance: 77 mL/min (by C-G formula based on SCr of 0.87 mg/dL).    Allergies  Allergen Reactions  . Erythromycin Nausea And Vomiting  . Sulfa Antibiotics Rash    Antimicrobials this admission: Cefazolin 7/24 >> Gentamicin 7/24>> Pen G 7/24>>  Dose adjustments this admission: Gent dose changed 7/27 2nd updated wt in EPIC  Microbiology results:  7/26 MRSA PCR neg  Thank you for allowing pharmacy to be a part of this patient's care.  Herby Abraham, Pharm.D. [phone] 10/05/2015 11:26 AM

## 2015-10-05 NOTE — Anesthesia Preprocedure Evaluation (Addendum)
Anesthesia Evaluation  Patient identified by MRN, date of birth, ID band Patient awake    Reviewed: Allergy & Precautions, H&P , NPO status , Patient's Chart, lab work & pertinent test results  Airway Mallampati: III  TM Distance: >3 FB Neck ROM: Full    Dental no notable dental hx. (+) Teeth Intact, Dental Advisory Given   Pulmonary neg pulmonary ROS,    Pulmonary exam normal breath sounds clear to auscultation       Cardiovascular hypertension,  Rhythm:Regular Rate:Normal     Neuro/Psych Anxiety negative neurological ROS     GI/Hepatic Neg liver ROS, GERD  Controlled,  Endo/Other  negative endocrine ROS  Renal/GU negative Renal ROS  negative genitourinary   Musculoskeletal   Abdominal   Peds  Hematology negative hematology ROS (+)   Anesthesia Other Findings   Reproductive/Obstetrics negative OB ROS                            Anesthesia Physical Anesthesia Plan  ASA: II  Anesthesia Plan: General and Regional   Post-op Pain Management: GA combined w/ Regional for post-op pain   Induction: Intravenous  Airway Management Planned: Oral ETT  Additional Equipment:   Intra-op Plan:   Post-operative Plan: Extubation in OR  Informed Consent: I have reviewed the patients History and Physical, chart, labs and discussed the procedure including the risks, benefits and alternatives for the proposed anesthesia with the patient or authorized representative who has indicated his/her understanding and acceptance.   Dental advisory given  Plan Discussed with: CRNA  Anesthesia Plan Comments:        Anesthesia Quick Evaluation

## 2015-10-05 NOTE — Anesthesia Postprocedure Evaluation (Signed)
Anesthesia Post Note  Patient: Larose A Oftedahl  Procedure(s) Performed: Procedure(s) (LRB): LEFT ELBOW IRRIGATION AND DEBRIDEMENT, OPEN REDUCTION INTERNAL FIXATION AND REPAIR (Left)  Patient location during evaluation: PACU Anesthesia Type: General and Regional Level of consciousness: awake and alert Pain management: pain level controlled Vital Signs Assessment: post-procedure vital signs reviewed and stable Respiratory status: spontaneous breathing, nonlabored ventilation, respiratory function stable and patient connected to nasal cannula oxygen Cardiovascular status: blood pressure returned to baseline and stable Postop Assessment: no signs of nausea or vomiting Anesthetic complications: no    Last Vitals:  Vitals:   10/05/15 1115 10/05/15 1120  BP: (!) 145/80 (!) 141/98  Pulse: 95 88  Resp: 17 20  Temp:      Last Pain:  Vitals:   10/05/15 0400  TempSrc:   PainSc: 2                  Somaya Grassi,W. EDMOND

## 2015-10-05 NOTE — Anesthesia Procedure Notes (Signed)
Anesthesia Regional Block:  Supraclavicular block  Pre-Anesthetic Checklist: ,, timeout performed, Correct Patient, Correct Site, Correct Laterality, Correct Procedure, Correct Position, site marked, Risks and benefits discussed, pre-op evaluation, post-op pain management  Laterality: Left  Prep: Maximum Sterile Barrier Precautions used, chloraprep       Needles:  Injection technique: Single-shot  Needle Type: Echogenic Stimulator Needle     Needle Length: 5cm 5 cm Needle Gauge: 22 and 22 G    Additional Needles:  Procedures: ultrasound guided (picture in chart) Supraclavicular block Narrative:  Start time: 10/05/2015 7:40 AM End time: 10/05/2015 7:50 AM Injection made incrementally with aspirations every 5 mL. Anesthesiologist: Gaynelle Adu  Additional Notes: 2% Lidocaine skin wheel.

## 2015-10-05 NOTE — Transfer of Care (Signed)
Immediate Anesthesia Transfer of Care Note  Patient: Tracy Wade  Procedure(s) Performed: Procedure(s): LEFT ELBOW IRRIGATION AND DEBRIDEMENT, OPEN REDUCTION INTERNAL FIXATION AND REPAIR (Left)  Patient Location: PACU  Anesthesia Type:General and Regional  Level of Consciousness: sedated  Airway & Oxygen Therapy: Patient connected to face mask oxygen  Post-op Assessment: Post -op Vital signs reviewed and stable  Post vital signs: stable  Last Vitals:  Vitals:   10/05/15 0400 10/05/15 0935  BP:  (!) 142/107  Pulse:  98  Resp: 18 (!) 23  Temp:      Last Pain:  Vitals:   10/05/15 0400  TempSrc:   PainSc: 2       Patients Stated Pain Goal: 0 (10/03/15 2227)  Complications: No apparent anesthesia complications

## 2015-10-06 ENCOUNTER — Encounter (HOSPITAL_COMMUNITY): Payer: Self-pay | Admitting: Orthopedic Surgery

## 2015-10-06 MED ORDER — METHOCARBAMOL 500 MG PO TABS: 500.0000 mg | ORAL_TABLET | Freq: Four times a day (QID) | ORAL | 0 refills | Status: DC

## 2015-10-06 MED ORDER — HYDROCODONE-ACETAMINOPHEN 5-300 MG PO TABS: 1.0000 | ORAL_TABLET | Freq: Four times a day (QID) | ORAL | 0 refills | Status: DC | PRN

## 2015-10-06 MED ORDER — CEPHALEXIN 500 MG PO CAPS: 500.0000 mg | ORAL_CAPSULE | Freq: Four times a day (QID) | ORAL | 0 refills | Status: DC

## 2015-10-06 MED ORDER — DOCUSATE SODIUM 100 MG PO CAPS: 100.0000 mg | ORAL_CAPSULE | Freq: Two times a day (BID) | ORAL | 0 refills | Status: DC

## 2015-10-06 MED ORDER — VITAMIN C 500 MG PO TABS: 500.0000 mg | ORAL_TABLET | Freq: Every day | ORAL | 0 refills | Status: DC

## 2015-10-06 MED ORDER — WHITE PETROLATUM GEL
Status: AC
  Administered 2015-10-06: 0.2
  Filled 2015-10-06: qty 1

## 2015-10-06 MED ORDER — AMLODIPINE BESYLATE 10 MG PO TABS
10.0000 mg | ORAL_TABLET | Freq: Once | ORAL | Status: AC
  Administered 2015-10-06: 10 mg via ORAL
  Filled 2015-10-06: qty 1

## 2015-10-06 NOTE — Discharge Summary (Signed)
Physician Discharge Summary  Patient ID: BHAWANA TKACH MRN: [id number] DOB/AGE: [birth year] 44 y.o.  Admit date: 09/29/2015 Discharge date: 10/07/2015  Admission Diagnoses: left elbow fx Past Medical History:  Diagnosis Date  . Fall from horse 09/29/2015   injury to brachial artery  . GERD (gastroesophageal reflux disease)   . Hypertension     Discharge Diagnoses:  Active Problems:   Open fracture dislocation of left elbow joint   Injury of left brachial artery   Fall from horse   Surgeries: Procedure(s): LEFT ELBOW IRRIGATION AND DEBRIDEMENT, OPEN REDUCTION INTERNAL FIXATION AND REPAIR on 09/29/2015 - 10/05/2015    Consultants: Treatment Team:  Bradly Bienenstock, MD  Discharged Condition: Improved  Hospital Course: MAICEE ZEBLEY is an 45 y.o. female who was admitted 09/29/2015 with a chief complaint of Chief Complaint  Patient presents with  . Fall    ELBOW FRACTURE   , and found to have a diagnosis of left elbow fx.  They were brought to the operating room on 09/29/2015 - 10/05/2015 and underwent Procedure(s): LEFT ELBOW IRRIGATION AND DEBRIDEMENT, OPEN REDUCTION INTERNAL FIXATION AND REPAIR.    They were given perioperative antibiotics: Anti-infectives    Start     Dose/Rate Route Frequency Ordered Stop   10/06/15 0000  cephALEXin (KEFLEX) 500 MG capsule     500 mg Oral 4 times daily 10/06/15 2029     10/02/15 2200  gentamicin (GARAMYCIN) 420 mg in dextrose 5 % 100 mL IVPB  Status:  Discontinued     7 mg/kg  59.7 kg (Adjusted) 110.5 mL/hr over 60 Minutes Intravenous Every 24 hours 10/02/15 1135 10/05/15 1606   10/01/15 0600  ceFAZolin (ANCEF) IVPB 2g/100 mL premix     2 g 200 mL/hr over 30 Minutes Intravenous On call to O.R. 09/30/15 1829 10/01/15 1856   09/30/15 1000  penicillin G potassium 2 Million Units in dextrose 5 % 50 mL IVPB     2 Million Units 100 mL/hr over 30 Minutes Intravenous Every 4 hours 09/30/15 0852     09/30/15 0600  ceFAZolin (ANCEF) IVPB 1 g/50  mL premix     1 g 100 mL/hr over 30 Minutes Intravenous Every 8 hours 09/30/15 0311     09/30/15 0030  penicillin G potassium 2 Million Units in dextrose 5 % 50 mL IVPB     2 Million Units 100 mL/hr over 30 Minutes Intravenous To Surgery 09/30/15 0021 09/30/15 0045   09/29/15 2245  gentamicin (GARAMYCIN) 460 mg in dextrose 5 % 100 mL IVPB  Status:  Discontinued     7 mg/kg  65 kg (Order-Specific) 111.5 mL/hr over 60 Minutes Intravenous Every 24 hours 09/29/15 2232 10/02/15 1135   09/29/15 2200  ceFAZolin (ANCEF) IVPB 2g/100 mL premix     2 g 200 mL/hr over 30 Minutes Intravenous  Once 09/29/15 2148 09/29/15 2257    .  They were given sequential compression devices, early ambulation, and Other (comment)AMBULATION for DVT prophylaxis.  Recent vital signs: Patient Vitals for the past 24 hrs:  BP Temp Temp src Pulse Resp SpO2  10/06/15 1409 136/88 98.3 F (36.8 C) Oral 100 16 100 %  10/06/15 1045 123/83 - - - - -  10/06/15 0504 (!) 147/102 98.5 F (36.9 C) Oral 97 16 100 %  10/06/15 0202 (!) 161/102 98.2 F (36.8 C) Oral 94 16 100 %  10/06/15 0000 (!) 153/96 - - 93 18 -  10/05/15 2041 (!) 155/103 98.7 F (37.1 C) Oral 96  16 100 %  .  Recent laboratory studies: No results found.  Discharge Medications:     Medication List    TAKE these medications   cephALEXin 500 MG capsule Commonly known as:  KEFLEX Take 1 capsule (500 mg total) by mouth 4 (four) times daily.   CO Q 10 PO Take 1 capsule by mouth daily.   docusate sodium 100 MG capsule Commonly known as:  COLACE Take 1 capsule (100 mg total) by mouth 2 (two) times daily.   Fish Oil 1000 MG Caps Take 1 capsule by mouth daily.   fluticasone 50 MCG/ACT nasal spray Commonly known as:  FLONASE Place 1 spray into both nostrils daily as needed for allergies or rhinitis.   Hydrocodone-Acetaminophen 5-300 MG Tabs Commonly known as:  VICODIN Take 1 tablet by mouth 4 (four) times daily as needed (PAIN).   methocarbamol  500 MG tablet Commonly known as:  ROBAXIN Take 1 tablet (500 mg total) by mouth 4 (four) times daily.   vitamin C 500 MG tablet Commonly known as:  ASCORBIC ACID Take 1 tablet (500 mg total) by mouth daily.       Diagnostic Studies: Dg Elbow 2 Views Left  Result Date: 09/29/2015 CLINICAL DATA:  Thrown from horse with open fracture of the left elbow EXAM: LEFT ELBOW - 2 VIEW COMPARISON:  None. FINDINGS: Single limited portable view of the left elbow was performed and reveals evidence of dislocation of the humerus with respect to the ulna and radius proximally as well as a radial neck fracture with impaction of the radial head at the fracture site. Air is noted within the soft tissues consistent with the open component. No other definitive fractures are seen at this time. Multiple small densities are noted in the surrounding soft tissue consistent with the recent injury. IMPRESSION: Fracture dislocation of the elbow joint as described. Electronically Signed   By: Alcide Clever M.D.   On: 09/29/2015 22:17  Ct Head Wo Contrast  Result Date: 09/29/2015 CLINICAL DATA:  Post fall from horse EXAM: CT HEAD WITHOUT CONTRAST CT CERVICAL SPINE WITHOUT CONTRAST TECHNIQUE: Multidetector CT imaging of the head and cervical spine was performed following the standard protocol without intravenous contrast. Multiplanar CT image reconstructions of the cervical spine were also generated. COMPARISON:  None. FINDINGS: CT HEAD FINDINGS Regional soft tissues appear normal. No displaced calvarial fracture. No radiopaque foreign body. Gray-white differentiation is maintained. No CT evidence acute large territory infarct. No intraparenchymal or extra-axial mass or hemorrhage. Normal size and configuration of the ventricles and basilar cisterns. No midline shift. Limited visualization the paranasal sinuses and mastoid air cells is normal. No air-fluid levels. CT CERVICAL SPINE FINDINGS C1 to the superior endplate of T1 is  imaged. Normal alignment of the cervical spine. No anterolisthesis or retrolisthesis. The dens is normally positioned between the lateral masses of C1. Normal atlantodental and atlantoaxial articulations. The bilateral facets are normally aligned. No fracture or static subluxation of the cervical spine. Cervical vertebral body heights are preserved. Prevertebral soft tissues are normal. 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 Regional soft tissues appear normal. No bulky cervical lymphadenopathy on this noncontrast examination. Normal noncontrast appearance of the thyroid gland. Limited visualization of the lung apices is normal. IMPRESSION: 1. Negative noncontrast head CT. 2. No  fracture or static subluxation of the cervical spine. 3. 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 Critical Value/emergent results were discussed in person at the time of interpretation on 09/29/2015 at 10:28 pm to Dr. Donell Beers, who verbally acknowledged these results. Electronically Signed   By: Simonne Come M.D.   On: 09/29/2015 22:29  Ct Cervical Spine Wo Contrast  Result Date: 09/29/2015 CLINICAL DATA:  Post fall from horse EXAM: CT HEAD WITHOUT CONTRAST CT CERVICAL SPINE WITHOUT CONTRAST TECHNIQUE: Multidetector CT imaging of the head and cervical spine was performed following the standard protocol without intravenous contrast. Multiplanar CT image reconstructions of the cervical spine were also generated. COMPARISON:  None. FINDINGS: CT HEAD FINDINGS Regional soft tissues appear normal. No displaced calvarial fracture. No radiopaque foreign body. Gray-white differentiation is maintained. No CT evidence acute large territory infarct. No intraparenchymal or extra-axial mass or  hemorrhage. Normal size and configuration of the ventricles and basilar cisterns. No midline shift. Limited visualization the paranasal sinuses and mastoid air cells is normal. No air-fluid levels. CT CERVICAL SPINE FINDINGS C1 to the superior endplate of T1 is imaged. Normal alignment of the cervical spine. No anterolisthesis or retrolisthesis. The dens is normally positioned between the lateral masses of C1. Normal atlantodental and atlantoaxial articulations. The bilateral facets are normally aligned. No fracture or static subluxation of the cervical spine. Cervical vertebral body heights are preserved. Prevertebral soft tissues are normal. 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 Regional soft tissues appear normal. No bulky cervical lymphadenopathy on this noncontrast examination. Normal noncontrast appearance of the thyroid gland. Limited visualization of the lung apices is normal. IMPRESSION: 1. Negative noncontrast head CT. 2. No fracture or static subluxation of the cervical spine. 3. 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 Critical Value/emergent results were discussed in person at the time of interpretation on 09/29/2015 at 10:28 pm to Dr. Donell Beers, who verbally acknowledged these results. Electronically Signed   By: Simonne Come M.D.   On: 09/29/2015 22:29  Dg Pelvis Portable  Result Date: 09/29/2015 CLINICAL DATA:  45 year old female with level 1 trauma. Patient fell off a horse. EXAM: PORTABLE PELVIS 1-2 VIEWS COMPARISON:  None. FINDINGS: There is no evidence of pelvic fracture or diastasis. No pelvic bone lesions are seen. IMPRESSION: Negative. Electronically Signed   By: Elgie Collard M.D.   On: 09/29/2015 22:16  Ct Elbow Left Wo  Contrast  Result Date: 09/30/2015 CLINICAL DATA:  Nonspecific (abnormal) findings on radiological and other examination of musculoskeletal system. Patient torn off a horse dislocated the elbow. Compound fracture. EXAM: CT OF THE LEFT ELBOW WITHOUT CONTRAST; 3-DIMENSIONAL CT IMAGE RENDERING ON ACQUISITION WORKSTATION TECHNIQUE: Multidetector CT imaging was performed according to the standard protocol. Multiplanar CT image reconstructions were also generated. 3-dimensional CT images were rendered by post-processing of the original CT data on an acquisition workstation. The 3-dimensional CT images were interpreted and findings were reported in the accompanying complete CT report for this study COMPARISON:  None. FINDINGS: Bones/Joint/Cartilage Comminuted fracture of the left radial head with articular surface involvement without significant displacement. Multiple small bony fragments within the left elbow joint space. No other fracture or dislocation. Normal alignment. No joint effusion. Ligaments Ligaments are suboptimally evaluated by CT. Muscles and Tendons Intact biceps tendon.  Intact triceps tendon. Soft  tissue No fluid collection or hematoma. No soft tissue mass. Small amount in the antecubital fossa which may be secondary to recent instrumentation. Small amount air in the biceps muscle. IMPRESSION: 1. Comminuted fracture of the left radial head with articular surface involvement without significant displacement. Multiple small bony fragments within the left elbow joint space. 2. Small amount in the antecubital fossa which may be secondary to recent instrumentation. Small amount air in the biceps muscle. Electronically Signed   By: Elige Ko   On: 09/30/2015 13:54  Ct 3d Recon At Scanner  Result Date: 09/30/2015 CLINICAL DATA:  Nonspecific (abnormal) findings on radiological and other examination of musculoskeletal system. Patient torn off a horse dislocated the elbow. Compound fracture. EXAM: CT OF THE  LEFT ELBOW WITHOUT CONTRAST; 3-DIMENSIONAL CT IMAGE RENDERING ON ACQUISITION WORKSTATION TECHNIQUE: Multidetector CT imaging was performed according to the standard protocol. Multiplanar CT image reconstructions were also generated. 3-dimensional CT images were rendered by post-processing of the original CT data on an acquisition workstation. The 3-dimensional CT images were interpreted and findings were reported in the accompanying complete CT report for this study COMPARISON:  None. FINDINGS: Bones/Joint/Cartilage Comminuted fracture of the left radial head with articular surface involvement without significant displacement. Multiple small bony fragments within the left elbow joint space. No other fracture or dislocation. Normal alignment. No joint effusion. Ligaments Ligaments are suboptimally evaluated by CT. Muscles and Tendons Intact biceps tendon.  Intact triceps tendon. Soft tissue No fluid collection or hematoma. No soft tissue mass. Small amount in the antecubital fossa which may be secondary to recent instrumentation. Small amount air in the biceps muscle. IMPRESSION: 1. Comminuted fracture of the left radial head with articular surface involvement without significant displacement. Multiple small bony fragments within the left elbow joint space. 2. Small amount in the antecubital fossa which may be secondary to recent instrumentation. Small amount air in the biceps muscle. Electronically Signed   By: Elige Ko   On: 09/30/2015 13:54  Dg Chest Port 1 View  Result Date: 09/29/2015 CLINICAL DATA:  Thrown from horse with known elbow fracture, initial encounter EXAM: PORTABLE CHEST 1 VIEW COMPARISON:  01/16/2013 FINDINGS: The heart size and mediastinal contours are within normal limits. Both lungs are clear. The visualized skeletal structures are unremarkable. IMPRESSION: No acute abnormality noted. Electronically Signed   By: Alcide Clever M.D.   On: 09/29/2015 22:16   They benefited maximally from  their hospital stay and there were no complications.     Disposition: 01-Home or Self Care  Follow-up Information    Early, Tawanna Cooler, MD In 2 weeks.   Specialties:  Vascular Surgery, Cardiology Why:  Office will call you to arrange your appt (sent) Contact information: [address] [place] [address] [phone]        Sharma Covert, MD.   Specialty:  Orthopedic Surgery Contact information: [address] [place] [address] [phone]            Signed: Sharma Covert 10/06/2015, 8:29 PM

## 2015-10-06 NOTE — Evaluation (Signed)
Occupational Therapy Evaluation Patient Details Name: Tracy Wade MRN: [id number] DOB: February 02, 1971 Today's Date: 10/06/2015    History of Present Illness OPEN REDUCTION INTERNAL FIXATION (ORIF) ELBOW/OLECRANON FRACTURE (Left). I & D   Clinical Impression   Pt with decline in function and safety with ADLs and ADL mobility with decreased endurance, impaired L UE functional use. L UE in soft cast/splint and sling during mobility. Pt is limited by pain. Pt very talkative with many questions and required extensive time to complete assessment. Pt with slow pace and guraded during mobility and required frequent breaks due to feeling nauseous. Pt very anxious about going home and possibly not being able to retrun to work when school starts back. Pt educated on ADL techniques, donning/doffing sling, positioning of L UE for edema control. Pt would benefit from acute OT services to address impairments to increase level of function and safety    Follow Up Recommendations  No OT follow up;Other (comment) (progress rehab of forearm/elbow per MD when appropriate)    Equipment Recommendations  None recommended by OT    Recommendations for Other Services       Precautions / Restrictions Precautions Precautions: Other (comment) Precaution Comments: Sling L UE, elbow fracture. Soft cast/splint Required Braces or Orthoses: Sling;Other Brace/Splint Other Brace/Splint: Soft cast/splint Restrictions Weight Bearing Restrictions: Yes LUE Weight Bearing: Non weight bearing Other Position/Activity Restrictions: L UE elevated when in bed, seated. Sling when up and moving around      Mobility Bed Mobility Overal bed mobility: Modified Independent             General bed mobility comments: using L rail to sit EOB  Transfers Overall transfer level: Needs assistance Equipment used: 1 person hand held assist Transfers: Sit to/from Stand Sit to Stand: Supervision         General transfer  comment: pt ambulating with slow pace, guarded. Initially with hand held assist, the pt able to push IV pole    Balance Overall balance assessment: No apparent balance deficits (not formally assessed)                                          ADL Overall ADL's : Needs assistance/impaired Eating/Feeding: Set up Eating/Feeding Details (indicate cue type and reason): assist to open containers Grooming: Wash/dry hands;Wash/dry face;Oral care;Set up;Supervision/safety;Standing   Upper Body Bathing: Moderate assistance   Lower Body Bathing: Moderate assistance   Upper Body Dressing : Moderate assistance   Lower Body Dressing: Total assistance Lower Body Dressing Details (indicate cue type and reason): total assist to donn socks Toilet Transfer: Supervision/safety;Ambulation;Comfort height toilet;Grab bars Toilet Transfer Details (indicate cue type and reason): slow pace, guarded mobility Toileting- Clothing Manipulation and Hygiene: Minimal assistance;Sit to/from stand   Tub/ Engineer, structural: 3 in 1;Ambulation;Grab bars Web designer Details (indicate cue type and reason): slow pace, guarded mobility Functional mobility during ADLs: Supervision/safety General ADL Comments: pt educated on bathing and dressing techniques, donning and doffing sling     Vision  wears glasses, no change from baseline              Pertinent Vitals/Pain Pain Assessment: 0-10 Pain Score: 6  Pain Location: L UE Pain Descriptors / Indicators: Aching;Discomfort Pain Intervention(s): Monitored during session;Premedicated before session;Repositioned;Limited activity within patient's tolerance     Hand Dominance Right   Extremity/Trunk Assessment Upper Extremity Assessment Upper Extremity Assessment: Generalized weakness;LUE  deficits/detail LUE Deficits / Details: able to wiggle fingers, shrug shoulders LUE: Unable to fully assess due to immobilization LUE Sensation:  (sensitive  to touch, tingling, numbness in fingers)       Cervical / Trunk Assessment Cervical / Trunk Assessment: Normal   Communication Communication Communication: No difficulties   Cognition Arousal/Alertness: Awake/alert (very talkative) Behavior During Therapy: WFL for tasks assessed/performed;Anxious Overall Cognitive Status: Within Functional Limits for tasks assessed                     General Comments   pt pleasant, cooperative, anxious and talkative    Exercises   Other Exercises Other Exercises: flexion/extension, abduction/adduction, and oppositon of L digits Other Exercises: L/R lateral neck flexion,  Other Exercises: neck flexion and extension   Shoulder Instructions      Home Living Family/patient expects to be discharged to:: Private residence Living Arrangements: Spouse/significant other;Children Available Help at Discharge: Family;Friend(s) Type of Home: House Home Access: Stairs to enter [employer] of Steps: 3 Entrance Stairs-Rails: None Home Layout: Two level;Able to live on main level with bedroom/bathroom;Full bath on main level Alternate Level Stairs-Number of Steps: flight Alternate Level Stairs-Rails: Right;Left Bathroom Shower/Tub: Tub/shower unit;Walk-in shower   Bathroom Toilet: Standard     Home Equipment: None          Prior Functioning/Environment Level of Independence: Independent        Comments: 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    OT Diagnosis: Acute pain;Generalized weakness   OT Problem List: Decreased range of motion;Pain;Impaired UE functional use;Decreased activity tolerance;Decreased coordination;Decreased knowledge of use of DME or AE   OT Treatment/Interventions: Self-care/ADL training;DME and/or AE instruction;Therapeutic activities;Therapeutic exercise;Patient/family education    OT Goals(Current goals can be found in the care plan section) Acute Rehab OT Goals Patient Stated Goal: get  better asap OT Goal Formulation: With patient Time For Goal Achievement: 10/06/15 Potential to Achieve Goals: Good ADL Goals Pt Will Perform Upper Body Bathing: with min assist;with caregiver independent in assisting Pt Will Perform Lower Body Bathing: with min assist;with caregiver independent in assisting Pt Will Perform Upper Body Dressing: with min assist;with caregiver independent in assisting Pt Will Perform Lower Body Dressing: with min assist;with caregiver independent in assisting Pt Will Transfer to Toilet: with modified independence Pt Will Perform Toileting - Clothing Manipulation and hygiene: with supervision  OT Frequency: Min 2X/week   Barriers to D/C:    none                     End of Session Equipment Utilized During Treatment: Other (comment) (sling) Nurse Communication: Mobility status  Activity Tolerance: Patient limited by pain Patient left: in chair;with call bell/phone within reach   Time: 3846-6599 OT Time Calculation (min): 85 min Charges:  OT General Charges $OT Visit: 1 Procedure OT Evaluation $OT Eval Moderate Complexity: 1 Procedure OT Treatments $Self Care/Home Management : 23-37 mins $Therapeutic Activity: 23-37 mins $Therapeutic Exercise: 8-22 mins G-Codes:    Galen Manila 10/06/2015, 11:05 AM

## 2015-10-06 NOTE — Progress Notes (Signed)
PT SEEN/EXAMINED ROUGH NIGHT LAST NIGHT DID NOT SLEEP WELL DOING BETTER TODAY WILL CONTINUE WITH INPATIENT CARE, WILL PLAN FOR D/C IN AM HUSBAND AT BEDSIDE .

## 2015-10-06 NOTE — Progress Notes (Signed)
PT Cancellation Note  Patient Details Name: Tracy Wade MRN: [id number] DOB: 06/05/1970   Cancelled Treatment:    Reason Eval/Treat Not Completed: PT screened, no needs identified, will sign off. Pt was evaluated by OT and no PT needs were identified at this time.    Tracy Wade 10/06/2015, 11:02 AM Deborah Chalk, PT, DPT [phone]

## 2015-10-06 NOTE — Discharge Instructions (Signed)
KEEP BANDAGE CLEAN AND DRY CALL OFFICE FOR F/U APPT [phone] IN 2 DAYS DR Melvyn Novas [phone] KEEP HAND ELEVATED ABOVE HEART OK TO APPLY ICE TO OPERATIVE AREA CONTACT OFFICE IF ANY WORSENING PAIN OR CONCERNS.

## 2015-10-07 LAB — BASIC METABOLIC PANEL
ANION GAP: 10 (ref 5–15)
BUN: 12 mg/dL (ref 6–20)
CO2: 27 mmol/L (ref 22–32)
Calcium: 9 mg/dL (ref 8.9–10.3)
Chloride: 99 mmol/L — ABNORMAL LOW (ref 101–111)
Creatinine, Ser: 0.97 mg/dL (ref 0.44–1.00)
GLUCOSE: 102 mg/dL — AB (ref 65–99)
POTASSIUM: 4 mmol/L (ref 3.5–5.1)
Sodium: 136 mmol/L (ref 135–145)

## 2015-10-07 NOTE — Op Note (Signed)
NAMEMAKENZEY, NANNI NO.:  [id number]  MEDICAL RECORD NO.:  [id number]  LOCATION:                                 FACILITY:  PHYSICIAN:  Sharma Covert IV, M.D.DATE OF BIRTH:  [birth year]  DATE OF PROCEDURE:  10/05/2015 DATE OF DISCHARGE:                              OPERATIVE REPORT   PREOPERATIVE DIAGNOSIS:  Left elbow open fracture dislocation.  POSTOPERATIVE DIAGNOSIS:  Left elbow open fracture dislocation.  ATTENDING PHYSICIAN:  Sharma Covert, M.D., who scrubbed and present for the entire procedure.  ASSISTANT SURGEON:  None.  ANESTHESIA:  General via LMA with supraclavicular block.  SURGICAL PROCEDURE: 1. Open treatment of left elbow dislocation. 2. Left elbow arthrotomy, exploration, and drainage. 3. Left elbow open treatment of radial head fracture without internal     fixation. 4. Left elbow lateral ulnar collateral ligament repair, primary     collateral ligament repair. 5. Radiographs 3 views, left elbow.  SURGICAL RADIOGRAPHIC INTERPRETATION:  AP, lateral, and oblique views of the elbow did show the concentric reduced radial, capitellar, and ulnar humeral joint.  There do not appear to be any loose bodies within the joint.  SURGICAL IMPLANTS:  Two Biomet 2.9 juggernaut anchors with a MaxBraid suture.  SURGICAL INDICATIONS:  Tracy Wade is a 45 year old right-hand-dominant female who sustained the open injury to the contaminated open fracture dislocation of the elbow.  The patient had been washed out 3 times and is now brought back today for potential definitive fixation.  Risks, benefits, and alternatives were discussed in detail with the patient and signed informed consent was obtained.  Risks include, but not limited to bleeding, infection, damage to nearby nerves, arteries, or tendons; loss of motion of wrist and digits, incomplete relief of symptoms, and need for further surgical intervention.  DESCRIPTION OF PROCEDURE:  The  patient was properly identified in the preoperative holding area, mark with a permanent marker made on left elbow to indicate the correct operative site.  The patient was then brought back to the operating room, placed supine on the anesthesia room table where general anesthesia was administered.  The patient tolerated this well.  A well-padded tourniquet was placed on the left brachium, sealed with 1000 drape.  Left upper extremity was then prepped and draped in normal sterile fashion.  Time-out was called, the correct side was identified, and the procedure then begun.  Previous skin sutures were then removed.  Deep dissection carried down through the capsule and the joint was then opened.  Arthrotomy, exploration, and drainage were then carried out.  The patient's soft tissues were good, there did not appear to be any soft tissue contamination today.  The radial head located in near anatomical position.  The radial head looked good, a small crack in the head less than 20% of the articular surface involving the head.  Once this was Wade, the joint was then thoroughly irrigated. The open treatment of the radial head was Wade without internal fixation.  This reduced rather nicely with the elbow in a slightly flexed position.  The 2 juggernaut anchors were then placed, one posteriorly for the posterior sleeve of tissue and the other one directly over the  origin of the lateral ulnar collateral ligament.  The posterior sleeve was then reinforced in the distal humerus with the MaxBraid suture.  The lateral ulnar collateral ligament complex was then cemented in a Krackow fashion, the suture was then run down the tendon and back up and tied down nicely.  This was supplemented with bringing the anterior soft tissues, anterior capsular and extensor origin back down to the bone nicely with the suture as well and tying it over in a pants-over-vest type fashion.  This provided good posterior  lateral stability.  The wounds were then irrigated at all levels.  Following closure, the elbow was then placed through a full range of motion on a mini C-arm and there was good alignment of the radial head and mini C- arm images were then obtained.  The soft tissues were closed with Vicryl, subcutaneous tissues closed with 4-0 Vicryl, and skin closed with simple Prolene sutures.  Xeroform dressings were then applied. Sterile compressive bandage was then applied.  The patient tolerated the procedure well, returned to the recovery room in good condition after being placed in a long-arm splint keeping the forearm in a slight pronated position.  POSTPROCEDURAL PLAN:  The patient will be admitted back to a hospital and likely discharged in the morning, seen back in the office in 1 week. X-rays at each visit.  We will go slow with her.  At the first postoperative visit, we will schedule her to see the therapist for a long-arm splint keeping her in slight pronation, static splinting for the first several weeks and we will start beginning some gentle toggle of movement once the soft tissues allow.  I will continue to be very cautious about the radial head and neck trying to leave the native radial head and neck in place given her young age and contaminated wound, try not to put any metal within the contaminated open fracture dislocation.  Radiographs, 3 views of the elbow at each visit.     Tracy Wade, M.D.     FWO/MEDQ  D:  10/05/2015  T:  10/05/2015  Job:  [id number]

## 2015-10-07 NOTE — Progress Notes (Signed)
Occupational Therapy Treatment Patient Details Name: Tracy Wade MRN: [id number] DOB: 1970/10/03 Today's Date: 10/07/2015    History of present illness OPEN REDUCTION INTERNAL FIXATION (ORIF) ELBOW/OLECRANON FRACTURE (Left). I & D   OT comments  Answered pt questions regarding ADLs/functional mobility/sling management. Patient reports she is going home today. Will have assistance from family/friends at discharge.   Follow Up Recommendations  No OT follow up;Other (comment) (progress rehab of forearm/elbow per MD when appropriate)    Equipment Recommendations  None recommended by OT    Recommendations for Other Services      Precautions / Restrictions Precautions Precautions: Other (comment) Precaution Comments: Sling L UE, elbow fracture. Soft cast/splint Required Braces or Orthoses: Sling;Other Brace/Splint Other Brace/Splint: Soft cast/splint Restrictions Weight Bearing Restrictions: Yes LUE Weight Bearing: Non weight bearing       Mobility Bed Mobility                  Transfers                      Balance                                   ADL Overall ADL's : Needs assistance/impaired                                       General ADL Comments: Patient with a lot of questions, plan is to go home today. Answered all of her questions regarding ADLs. Discussed don/doff shirt, bathing, shower transfer, sling management, ROM L digits, positioning of arm in bed and in chair. Patient will have assistance from family and friends at discharge. Patient had questions about return to work, began discussion but educated patient to focus on managing at home first.      Vision                     Perception     Praxis      Cognition   Behavior During Therapy: [facility] for tasks assessed/performed;Anxious Overall Cognitive Status: Within Functional Limits for tasks assessed                        Extremity/Trunk Assessment               Exercises Other Exercises Other Exercises: L digit ROM Other Exercises: L/R lateral neck flexion,  Other Exercises: neck flexion and extension   Shoulder Instructions       General Comments      Pertinent Vitals/ Pain       Pain Assessment: 0-10 Pain Score: 5  Pain Location: LUE Pain Descriptors / Indicators: Discomfort;Aching;Sore Pain Intervention(s): Monitored during session;Limited activity within patient's tolerance  Home Living                                          Prior Functioning/Environment              Frequency Min 2X/week     Progress Toward Goals  OT Goals(current goals can now be found in the care plan section)  Progress towards OT goals: Progressing toward goals  Acute Rehab OT Goals Patient Stated Goal: get  better asap  Plan Discharge plan remains appropriate    Co-evaluation                 End of Session Equipment Utilized During Treatment: Other (comment) (sling)   Activity Tolerance Patient tolerated treatment well   Patient Left in bed;with call bell/phone within reach   Nurse Communication          Time: 9147-8295 OT Time Calculation (min): 31 min  Charges: OT General Charges $OT Visit: 1 Procedure OT Treatments $Self Care/Home Management : 23-37 mins  Tracy Wade A 10/07/2015, 9:40 AM

## 2015-10-07 NOTE — Progress Notes (Signed)
Discussed discharge summary with patient. Reviewed all medications with patient. Patient received Rx. Patient ready for discharge. Patient transported out via wheelchair. 

## 2015-10-09 ENCOUNTER — Encounter: Payer: Self-pay | Admitting: Vascular Surgery

## 2015-10-15 ENCOUNTER — Ambulatory Visit (INDEPENDENT_AMBULATORY_CARE_PROVIDER_SITE_OTHER): Payer: Self-pay | Admitting: Vascular Surgery

## 2015-10-15 ENCOUNTER — Encounter: Payer: Self-pay | Admitting: Vascular Surgery

## 2015-10-15 VITALS — BP 140/97 | HR 93 | Ht 63.0 in | Wt 143.8 lb

## 2015-10-15 DIAGNOSIS — S45112D Laceration of brachial artery, left side, subsequent encounter: Secondary | ICD-10-CM

## 2015-10-15 NOTE — Progress Notes (Signed)
   Patient name: Tracy JarvisKimberley A Wade MRN: [id number] DOB: Aug 15, 1970 Sex: female  REASON FOR VISIT: Follow-up of repair of brachial artery transection  HPI: Tracy Wade is a 45 y.o. female here today for follow-up. She suffered a fall from a horse with open fracture of her left elbow. 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 She had reverse saphenous vein with placement of her brachial artery during the initial procedure and subsequently underwent elbow stabilization. She did well the hospital was discharged home. She is here today for follow-up. She continues to have her splint in place keeping her elbow from moving. Her hand is quite warm and she has minimal numbness in her hand. She does have Doppler flow at the anatomic snuffbox. Her vein harvest incision is completely healed.  Current Outpatient Prescriptions  Medication Sig Dispense Refill  . cephALEXin (KEFLEX) 500 MG capsule Take 1 capsule (500 mg total) by mouth 4 (four) times daily. 40 capsule 0  . Coenzyme Q10 (CO Q 10 PO) Take 1 capsule by mouth daily.    Marland Kitchen. docusate sodium (COLACE) 100 MG capsule Take 1 capsule (100 mg total) by mouth 2 (two) times daily. 30 capsule 0  . Hydrocodone-Acetaminophen (VICODIN) 5-300 MG TABS Take 1 tablet by mouth 4 (four) times daily as needed (PAIN). 40 each 0  . methocarbamol (ROBAXIN) 500 MG tablet Take 1 tablet (500 mg total) by mouth 4 (four) times daily. 30 tablet 0  . Omega-3 Fatty Acids (FISH OIL) 1000 MG CAPS Take 1 capsule by mouth daily.    . vitamin C (ASCORBIC ACID) 500 MG tablet Take 1 tablet (500 mg total) by mouth daily. 50 tablet 0  . fluticasone (FLONASE) 50 MCG/ACT nasal spray Place 1 spray into both nostrils daily as needed for allergies or rhinitis.    Marland Kitchen. traMADol (ULTRAM) 50 MG tablet      No current facility-administered medications for this visit.      PHYSICAL EXAM: Vitals:   10/15/15 1413 10/15/15 1417  BP:  (!) 142/101 (!) 140/97  Pulse: 93   Weight: 143 lb 12.8 oz (65.2 kg)   Height: 5\' 3"  (1.6 m)     GENERAL: The patient is a well-nourished female, in no acute distress. The vital signs are documented above. Left hand is warm with minimal neurologic deficit Left vein harvest incision healed in her ankle  MEDICAL ISSUES: Overall. We'll continue follow-up with her orthopedic surgeon Dr. Melvyn Novasrtmann. We will see her again in 3 months for continued follow   Larina Earthlyodd F. Mariam Helbert, MD [facility] Office Tel [phone] Pager [phone]

## 2016-01-20 ENCOUNTER — Ambulatory Visit: Payer: BC Managed Care – PPO | Admitting: Vascular Surgery

## 2016-02-04 ENCOUNTER — Encounter: Payer: Self-pay | Admitting: Vascular Surgery

## 2016-02-10 ENCOUNTER — Ambulatory Visit: Payer: BC Managed Care – PPO | Admitting: Vascular Surgery

## 2016-03-22 ENCOUNTER — Other Ambulatory Visit: Payer: Self-pay | Admitting: Orthopedic Surgery

## 2016-03-22 DIAGNOSIS — S42402D Unspecified fracture of lower end of left humerus, subsequent encounter for fracture with routine healing: Secondary | ICD-10-CM

## 2016-03-31 ENCOUNTER — Ambulatory Visit
Admission: RE | Admit: 2016-03-31 | Discharge: 2016-03-31 | Disposition: A | Discharge status: Home / Self Care | Payer: BC Managed Care – PPO | Source: Ambulatory Visit | Attending: Orthopedic Surgery | Admitting: Orthopedic Surgery

## 2016-03-31 DIAGNOSIS — S42402D Unspecified fracture of lower end of left humerus, subsequent encounter for fracture with routine healing: Secondary | ICD-10-CM

## 2016-07-16 ENCOUNTER — Encounter (HOSPITAL_COMMUNITY): Payer: Self-pay

## 2016-07-16 ENCOUNTER — Emergency Department (HOSPITAL_COMMUNITY): Payer: BC Managed Care – PPO

## 2016-07-16 ENCOUNTER — Emergency Department (HOSPITAL_COMMUNITY)
Admission: EM | Admit: 2016-07-16 | Discharge: 2016-07-17 | Disposition: A | Discharge status: Home / Self Care | Payer: BC Managed Care – PPO | Attending: Emergency Medicine | Admitting: Emergency Medicine

## 2016-07-16 DIAGNOSIS — R002 Palpitations: Secondary | ICD-10-CM | POA: Diagnosis not present

## 2016-07-16 DIAGNOSIS — Z79899 Other long term (current) drug therapy: Secondary | ICD-10-CM | POA: Insufficient documentation

## 2016-07-16 DIAGNOSIS — I10 Essential (primary) hypertension: Secondary | ICD-10-CM | POA: Diagnosis not present

## 2016-07-16 HISTORY — DX: Anxiety disorder, unspecified: F41.9

## 2016-07-16 LAB — CBC
HCT: 46.1 % — ABNORMAL HIGH (ref 36.0–46.0)
HEMOGLOBIN: 15.9 g/dL — AB (ref 12.0–15.0)
MCH: 31.4 pg (ref 26.0–34.0)
MCHC: 34.5 g/dL (ref 30.0–36.0)
MCV: 91.1 fL (ref 78.0–100.0)
Platelets: 232 10*3/uL (ref 150–400)
RBC: 5.06 MIL/uL (ref 3.87–5.11)
RDW: 12.5 % (ref 11.5–15.5)
WBC: 7.2 10*3/uL (ref 4.0–10.5)

## 2016-07-16 LAB — I-STAT TROPONIN, ED: TROPONIN I, POC: 0 ng/mL (ref 0.00–0.08)

## 2016-07-16 LAB — POC URINE PREG, ED: PREG TEST UR: NEGATIVE

## 2016-07-16 LAB — BASIC METABOLIC PANEL
ANION GAP: 11 (ref 5–15)
BUN: 15 mg/dL (ref 6–20)
CALCIUM: 9.4 mg/dL (ref 8.9–10.3)
CO2: 20 mmol/L — AB (ref 22–32)
Chloride: 105 mmol/L (ref 101–111)
Creatinine, Ser: 0.78 mg/dL (ref 0.44–1.00)
GFR calc Af Amer: >60 mL/min (ref >60)
GLUCOSE: 111 mg/dL — AB (ref 65–99)
Potassium: 3.5 mmol/L (ref 3.5–5.1)
Sodium: 136 mmol/L (ref 135–145)

## 2016-07-16 MED ORDER — METOPROLOL TARTRATE 5 MG/5ML IV SOLN
5.0000 mg | Freq: Once | INTRAVENOUS | Status: AC
  Administered 2016-07-17: 5 mg via INTRAVENOUS
  Filled 2016-07-16: qty 5

## 2016-07-16 NOTE — ED Provider Notes (Signed)
[facility]-EMERGENCY DEPT Provider Note    By signing my name below, I, Earmon Phoenix, attest that this documentation has been prepared under the direction and in the presence of Azalia Bilis, MD. Electronically Signed: Earmon Phoenix, ED Scribe. 07/16/16. 11:57 PM.    History   Chief Complaint Chief Complaint  Patient presents with  . Palpitations  . Hypertension    The history is provided by the patient and medical records. No language interpreter was used.   HPI Comments:  Tracy Wade is a 46 y.o. female, with PMHx of anxiety, HTN brought in by EMS, who presents to the Emergency Department complaining of palpitations that began earlier this evening. She states she was resting on the couch when she felt her heart beating fast. She states her smart watch alerted her that her HR was 132 which made her anxious. She has not taken anything for symptom relief. She denies modifying factors. She denies nausea, vomiting, CP. She reports h/o panic attacks that last around 20 minutes. She reports only taking fish oil and Co-Q-10 daily. She denies excessive caffeine intake. She reports eating poorly (fast food) in the past week which she normally does not do. Her PCP is Dr. Maurice Small. She denies having her thyroid checked in the past.   Past Medical History:  Diagnosis Date  . Anxiety   . Fall from horse 09/29/2015   injury to brachial artery  . GERD (gastroesophageal reflux disease)   . Hypertension     Patient Active Problem List   Diagnosis Date Noted  . 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 Fall from horse 09/30/2015  . Open fracture dislocation of left elbow joint 09/29/2015  . Stress at work 05/30/2011  . Anxiety with somatization 05/30/2011  . Nonspecific abnormal electrocardiogram (ECG) (EKG) 05/30/2011  . White coat hypertension 05/30/2011    Past Surgical History:  Procedure Laterality Date  . ARTERY REPAIR Left 09/29/2015   Procedure: TRANSVERSE  BRACHIAL ARTERY REPAIR;  Surgeon: Larina Earthly, MD;  Location: [facility] OR;  Service: Vascular;  Laterality: Left;  . CESAREAN SECTION     and numerous other gynecological procedures  . CLOSED REDUCTION ELBOW FRACTURE Left 09/29/2015   Procedure: CLOSED REDUCTION ELBOW;  Surgeon: Durene Romans, MD;  Location: [facility] OR;  Service: Orthopedics;  Laterality: Left;  . ENDOVEIN HARVEST OF GREATER SAPHENOUS VEIN  09/29/2015   Procedure: REVERSE GREATER SAPHENOUS VEIN;  Surgeon: Larina Earthly, MD;  Location: [facility] OR;  Service: Vascular;;  . I&D EXTREMITY Left 09/29/2015   Procedure: IRRIGATION AND DEBRIDEMENT,  LEFT ELBOW;  Surgeon: Durene Romans, MD;  Location: [facility] OR;  Service: Orthopedics;  Laterality: Left;  . I&D EXTREMITY Left 10/01/2015   Procedure: IRRIGATION AND DEBRIDEMENT LEFT ELBOW;  Surgeon: Bradly Bienenstock, MD;  Location: [facility] OR;  Service: Orthopedics;  Laterality: Left;  left elbow  . I&D EXTREMITY Left 10/03/2015   Procedure: IRRIGATION AND DEBRIDEMENT LEFT ELBOW;  Surgeon: Bradly Bienenstock, MD;  Location: [facility] OR;  Service: Orthopedics;  Laterality: Left;  . I&D EXTREMITY Left 10/05/2015   Procedure: LEFT ELBOW IRRIGATION AND DEBRIDEMENT, OPEN REDUCTION INTERNAL FIXATION AND REPAIR;  Surgeon: Bradly Bienenstock, MD;  Location: [facility] OR;  Service: Orthopedics;  Laterality: Left;  . MYOMECTOMY     fibroid tumor    OB History    No data available       Home Medications    Prior to Admission medications   Medication Sig Start Date End Date Taking? Authorizing Provider  cephALEXin (KEFLEX) 500 MG capsule Take 1 capsule (500 mg total) by mouth 4 (four) times daily. 10/06/15   Bradly Bienenstock, MD  Coenzyme Q10 (CO Q 10 PO) Take 1 capsule by mouth daily.    [provider]  docusate sodium (COLACE) 100 MG capsule Take 1 capsule (100 mg total) by mouth 2 (two) times daily. 10/06/15   Bradly Bienenstock, MD  fluticasone (FLONASE) 50 MCG/ACT nasal spray Place 1 spray into both nostrils daily as needed for allergies or  rhinitis.    [provider]  Hydrocodone-Acetaminophen (VICODIN) 5-300 MG TABS Take 1 tablet by mouth 4 (four) times daily as needed (PAIN). 10/06/15   Bradly Bienenstock, MD  methocarbamol (ROBAXIN) 500 MG tablet Take 1 tablet (500 mg total) by mouth 4 (four) times daily. 10/06/15   Bradly Bienenstock, MD  Omega-3 Fatty Acids (FISH OIL) 1000 MG CAPS Take 1 capsule by mouth daily.    [provider]  traMADol Janean Sark) 50 MG tablet  10/08/15   [provider]  vitamin C (ASCORBIC ACID) 500 MG tablet Take 1 tablet (500 mg total) by mouth daily. 10/06/15   Bradly Bienenstock, MD    Family History Family History  Problem Relation Age of Onset  . Heart failure Father   . Heart attack Father 31  . Stroke Father     Social History Social History  Substance Use Topics  . Smoking status: Never Smoker  . Smokeless tobacco: Never Used  . Alcohol use Yes     Comment: rare glass of wine     Allergies   Erythromycin; Gentamicin; and Sulfa antibiotics   Review of Systems Review of Systems All other systems reviewed and are negative for acute change except as noted in the HPI.   Physical Exam Updated Vital Signs BP (!) 166/105   Pulse (!) 105   Temp 98.4 F (36.9 C) (Oral)   Resp 18   LMP 06/30/2016 (Approximate)   SpO2 100%   Physical Exam  Constitutional: She is oriented to person, place, and time. She appears well-developed and well-nourished. No distress.  HENT:  Head: Normocephalic and atraumatic.  Eyes: EOM are normal.  Neck: Normal range of motion.  Cardiovascular: Regular rhythm and normal heart sounds.  Tachycardia present.   Pulmonary/Chest: Effort normal and breath sounds normal.  Abdominal: Soft. She exhibits no distension. There is no tenderness.  Musculoskeletal: Normal range of motion.  Neurological: She is alert and oriented to person, place, and time.  Skin: Skin is warm and dry.  Psychiatric: She has a normal mood and affect. Judgment normal.    Nursing note and vitals reviewed.    ED Treatments / Results  DIAGNOSTIC STUDIES: Oxygen Saturation is 100% on RA, normal by my interpretation.   COORDINATION OF CARE: 11:55 PM- Informed pt of negative labs. Additional labs ordered. Will prescribe Metoprolol for rate control and refer to cardiology. Pt verbalizes understanding and agrees to plan.  Medications  metoprolol (LOPRESSOR) injection 5 mg (5 mg Intravenous Given 07/17/16 0005)    Labs (all labs ordered are listed, but only abnormal results are displayed) Labs Reviewed  BASIC METABOLIC PANEL - Abnormal; Notable for the following:       Result Value   CO2 20 (*)    Glucose, Bld 111 (*)    All other components within normal limits  CBC - Abnormal; Notable for the following:    Hemoglobin 15.9 (*)    HCT 46.1 (*)    All other components  within normal limits  TSH  POC URINE PREG, ED  I-STAT TROPOININ, ED    EKG  EKG Interpretation  Date/Time:  Friday Jul 16 2016 20:04:22 EDT Ventricular Rate:  105 PR Interval:  178 QRS Duration: 84 QT Interval:  358 QTC Calculation: 473 R Axis:   84 Text Interpretation:  Sinus tachycardia Biatrial enlargement Abnormal ECG No old tracing to compare Confirmed by Telma Pyeatt  MD, Caryn BeeKEVIN ([id number]) on 07/16/2016 11:23:39 PM       Radiology Dg Chest 2 View  Result Date: 07/16/2016 CLINICAL DATA:  Palpitations and tachycardia. EXAM: CHEST  2 VIEW COMPARISON:  09/29/2015 FINDINGS: The cardiomediastinal contours are normal. The lungs are clear. Pulmonary vasculature is normal. No consolidation, pleural effusion, or pneumothorax. No acute osseous abnormalities are seen. IMPRESSION: No acute abnormality. Electronically Signed   By: Rubye OaksMelanie  Ehinger M.D.   On: 07/16/2016 20:54    Procedures Procedures (including critical care time)  Medications Ordered in ED Medications  metoprolol (LOPRESSOR) injection 5 mg (not administered)     Initial Impression / Assessment and Plan / ED Course  I  have reviewed the triage vital signs and the nursing notes.  Pertinent labs & imaging results that were available during my care of the patient were reviewed by me and considered in my medical decision making (see chart for details).     A symptom Magnetom my evaluation.  No longer having palpitations.  Outpatient cardiology follow-up for likely Holter monitor.  Patient placed on low-dose beta blocker.  Could represent atrial fib/atrial flutter.  Patient be discharged home in good condition.  She understands return to the ER for new or worsening symptoms  Final Clinical Impressions(s) / ED Diagnoses   Final diagnoses:  Palpitations    New Prescriptions New Prescriptions   No medications on file    I personally performed the services described in this documentation, which was scribed in my presence. The recorded information has been reviewed and is accurate.        Azalia Bilisampos, Rahman Ferrall, MD 07/17/16 [phone]

## 2016-07-16 NOTE — ED Notes (Signed)
Updated on wait for treatment room. 

## 2016-07-16 NOTE — ED Triage Notes (Signed)
Pt arrives via EMS; pt was awaken by palpitations tonight; pt was hypertensive on EMS arrival at 210/100 and HR of 120; Pt has hx of HTN with no medications as well as Anxiety with no medications; Pt denies pain or SOB on arrival; pt is hypertensive on arrival at 174/106 with HR 108; Pt a&ox 4 on arrival;

## 2016-07-17 LAB — TSH: TSH: 2.115 u[IU]/mL (ref 0.350–4.500)

## 2016-07-17 MED ORDER — METOPROLOL TARTRATE 25 MG PO TABS: 25.0000 mg | ORAL_TABLET | Freq: Two times a day (BID) | ORAL | 0 refills | Status: DC

## 2016-07-29 ENCOUNTER — Other Ambulatory Visit: Payer: Self-pay | Admitting: Orthopedic Surgery

## 2016-07-29 DIAGNOSIS — S42402D Unspecified fracture of lower end of left humerus, subsequent encounter for fracture with routine healing: Secondary | ICD-10-CM

## 2016-08-03 ENCOUNTER — Emergency Department (HOSPITAL_COMMUNITY)
Admission: EM | Admit: 2016-08-03 | Discharge: 2016-08-03 | Disposition: A | Discharge status: Home / Self Care | Payer: BC Managed Care – PPO | Attending: Emergency Medicine | Admitting: Emergency Medicine

## 2016-08-03 ENCOUNTER — Encounter (HOSPITAL_COMMUNITY): Payer: Self-pay | Admitting: Emergency Medicine

## 2016-08-03 DIAGNOSIS — K219 Gastro-esophageal reflux disease without esophagitis: Secondary | ICD-10-CM | POA: Diagnosis not present

## 2016-08-03 DIAGNOSIS — I10 Essential (primary) hypertension: Secondary | ICD-10-CM | POA: Insufficient documentation

## 2016-08-03 DIAGNOSIS — Z79899 Other long term (current) drug therapy: Secondary | ICD-10-CM | POA: Insufficient documentation

## 2016-08-03 DIAGNOSIS — R109 Unspecified abdominal pain: Secondary | ICD-10-CM | POA: Diagnosis present

## 2016-08-03 DIAGNOSIS — E876 Hypokalemia: Secondary | ICD-10-CM | POA: Diagnosis not present

## 2016-08-03 DIAGNOSIS — R000 Tachycardia, unspecified: Secondary | ICD-10-CM | POA: Insufficient documentation

## 2016-08-03 LAB — MAGNESIUM: Magnesium: 2.4 mg/dL (ref 1.7–2.4)

## 2016-08-03 LAB — BASIC METABOLIC PANEL
ANION GAP: 14 (ref 5–15)
BUN: 13 mg/dL (ref 6–20)
CALCIUM: 9.2 mg/dL (ref 8.9–10.3)
CO2: 21 mmol/L — ABNORMAL LOW (ref 22–32)
CREATININE: 0.97 mg/dL (ref 0.44–1.00)
Chloride: 100 mmol/L — ABNORMAL LOW (ref 101–111)
Glucose, Bld: 137 mg/dL — ABNORMAL HIGH (ref 65–99)
Potassium: 3 mmol/L — ABNORMAL LOW (ref 3.5–5.1)
SODIUM: 135 mmol/L (ref 135–145)

## 2016-08-03 LAB — CBC
HCT: 45 % (ref 36.0–46.0)
Hemoglobin: 15.4 g/dL — ABNORMAL HIGH (ref 12.0–15.0)
MCH: 31 pg (ref 26.0–34.0)
MCHC: 34.2 g/dL (ref 30.0–36.0)
MCV: 90.5 fL (ref 78.0–100.0)
PLATELETS: 276 10*3/uL (ref 150–400)
RBC: 4.97 MIL/uL (ref 3.87–5.11)
RDW: 12.3 % (ref 11.5–15.5)
WBC: 8.3 10*3/uL (ref 4.0–10.5)

## 2016-08-03 LAB — I-STAT TROPONIN, ED: TROPONIN I, POC: 0 ng/mL (ref 0.00–0.08)

## 2016-08-03 MED ORDER — METOCLOPRAMIDE HCL 10 MG PO TABS: 10.0000 mg | ORAL_TABLET | Freq: Three times a day (TID) | ORAL | 0 refills | Status: DC

## 2016-08-03 MED ORDER — METOCLOPRAMIDE HCL 10 MG PO TABS
10.0000 mg | ORAL_TABLET | Freq: Once | ORAL | Status: AC
  Administered 2016-08-03: 10 mg via ORAL
  Filled 2016-08-03: qty 1

## 2016-08-03 MED ORDER — METOPROLOL TARTRATE 25 MG PO TABS
25.0000 mg | ORAL_TABLET | Freq: Once | ORAL | Status: AC
  Administered 2016-08-03: 25 mg via ORAL
  Filled 2016-08-03: qty 1

## 2016-08-03 MED ORDER — POTASSIUM CHLORIDE CRYS ER 20 MEQ PO TBCR
40.0000 meq | EXTENDED_RELEASE_TABLET | Freq: Once | ORAL | Status: AC
  Administered 2016-08-03: 40 meq via ORAL
  Filled 2016-08-03: qty 2

## 2016-08-03 MED ORDER — POTASSIUM CHLORIDE CRYS ER 20 MEQ PO TBCR: 20.0000 meq | EXTENDED_RELEASE_TABLET | Freq: Two times a day (BID) | ORAL | 0 refills | Status: DC

## 2016-08-03 NOTE — Discharge Instructions (Signed)
Please increase your Metoprolol to 50 mg (two tablets) a day.  Please take Nexium and Zantac twice a day.  You are being given a prescription for metoclopramide (Reglan) as a trial to see if it will help decrease the fluid washing up in to your throat. Decision on how to go forward from here will be made by your gastroenterologist.

## 2016-08-03 NOTE — ED Triage Notes (Signed)
Reports being here 2 weeks ago with tachycardia.  Reports waking up tonight with heart racing.  Took Metoprolol 25mg  around 2300 with no relief.  Also c/o "heartburn".

## 2016-08-03 NOTE — ED Provider Notes (Signed)
[facility]-EMERGENCY DEPT Provider Note   CSN: [id number] Arrival date & time: 08/03/16  4098   By signing my name below, I, Tracy Wade, attest that this documentation has been prepared under the direction and in the presence of Tracy Booze, MD. Electronically signed, Tracy Wade, ED Scribe. 08/03/16. 3:05 AM.   History   Chief Complaint Chief Complaint  Patient presents with  . Tachycardia  . Abdominal Pain   The history is provided by the patient and medical records. No language interpreter was used.    Tracy Wade is a 46 y.o. female with h/o GERD and HTN who presents to the Emergency Department with concern for "stomach gurgling" sensation that radiates into her throat x > 4 weeks. Pt also c/o intermittent palpitations. Pt states she has taken Nexium 20 mg and 40 mg, respectively without relief. Pt adds she was seen by her gastroenterologist's PA when she was prescribed an undisclosed medication recently. She states she took this medication x 2 days and this provided mild relief but it caused a "skin crawling" sensation. She states she was advised to quit this and take zantac and Mylanta via an online physician evaluation. She reports this has not provided relief. She adds she experienced heart palpitations and choking sensation waking her up from sleep 4 nights ago. Pt taking prescribed metoprolol 50 mg prescribed 07/16/2016 in [facility] ED by Azalia Bilis, MD for heart palpitations as advised at home. She notes relief from this medication. Upcoming endoscopy noted in 2 days. She states she refilled this prescription 2-3 nights ago and her dose was reduced to 25 mg at the time. No other complaints at this time.   Past Medical History:  Diagnosis Date  . Anxiety   . Fall from horse 09/29/2015   injury to brachial artery  . GERD (gastroesophageal reflux disease)   . Hypertension     Patient Active Problem List   Diagnosis Date Noted  . 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  Fall from horse 09/30/2015  . Open fracture dislocation of left elbow joint 09/29/2015  . Stress at work 05/30/2011  . Anxiety with somatization 05/30/2011  . Nonspecific abnormal electrocardiogram (ECG) (EKG) 05/30/2011  . White coat hypertension 05/30/2011    Past Surgical History:  Procedure Laterality Date  . ARTERY REPAIR Left 09/29/2015   Procedure: TRANSVERSE BRACHIAL ARTERY REPAIR;  Surgeon: Larina Earthly, MD;  Location: [facility] OR;  Service: Vascular;  Laterality: Left;  . CESAREAN SECTION     and numerous other gynecological procedures  . CLOSED REDUCTION ELBOW FRACTURE Left 09/29/2015   Procedure: CLOSED REDUCTION ELBOW;  Surgeon: Durene Romans, MD;  Location[facility] OR;  Service: Orthopedics;  Laterality: Left;  . ENDOVEIN HARVEST OF GREATER SAPHENOUS VEIN  09/29/2015   Procedure: REVERSE GREATER SAPHENOUS VEIN;  Surgeon: Larina Earthly, MD;  Location: [facility] OR;  Service: Vascular;;  . I&D EXTREMITY Left 09/29/2015   Procedure: IRRIGATION AND DEBRIDEMENT,  LEFT ELBOW;  Surgeon: Durene Romans, MD;  Location: [facility] OR;  Service: Orthopedics;  Laterality: Left;  . I&D EXTREMITY Left 10/01/2015   Procedure: IRRIGATION AND DEBRIDEMENT LEFT ELBOW;  Surgeon: Bradly Bienenstock, MD;  Location: [facility] OR;  Service: Orthopedics;  Laterality: Left;  left elbow  . I&D EXTREMITY Left 10/03/2015   Procedure: IRRIGATION AND DEBRIDEMENT LEFT ELBOW;  Surgeon: Bradly Bienenstock, MD;  Location: [facility] OR;  Service: Orthopedics;  Laterality: Left;  . I&D EXTREMITY Left 10/05/2015   Procedure: LEFT ELBOW IRRIGATION AND DEBRIDEMENT, OPEN  REDUCTION INTERNAL FIXATION AND REPAIR;  Surgeon: Bradly Bienenstock, MD;  Location: [facility] OR;  Service: Orthopedics;  Laterality: Left;  . MYOMECTOMY     fibroid tumor    OB History    No data available       Home Medications    Prior to Admission medications   Medication Sig Start Date End Date Taking? Authorizing Provider  Coenzyme Q10 (CO Q 10 PO) Take 1 capsule by mouth daily.    [provider]  fluticasone (FLONASE) 50 MCG/ACT nasal spray Place 1 spray into both nostrils daily as needed for allergies or rhinitis.    [provider]  HYDROcodone-acetaminophen (NORCO/VICODIN) 5-325 MG tablet Take 1 tablet by mouth 2 (two) times a week.    [provider]  metoprolol (LOPRESSOR) 25 MG tablet Take 1 tablet (25 mg total) by mouth 2 (two) times daily. 07/17/16 07/31/16  Azalia Bilis, MD  Omega-3 Fatty Acids (FISH OIL) 1000 MG CAPS Take 1 capsule by mouth daily.    [provider]    Family History Family History  Problem Relation Age of Onset  . Heart failure Father   . Heart attack Father 59  . Stroke Father     Social History Social History  Substance Use Topics  . Smoking status: Never Smoker  . Smokeless tobacco: Never Used  . Alcohol use Yes     Comment: rare glass of wine     Allergies   Erythromycin; Gentamicin; and Sulfa antibiotics   Review of Systems Review of Systems  HENT:       +throat discomfort  Cardiovascular: Positive for palpitations.  Gastrointestinal: Positive for abdominal pain. Negative for nausea and vomiting.  All other systems reviewed and are negative.    Physical Exam Updated Vital Signs BP (!) 151/105 (BP Location: Left Arm)   Temp 97.7 F (36.5 C) (Oral)   Resp 18   Ht 5\' 3"  (1.6 m)   Wt 135 lb (61.2 kg)   LMP 07/26/2016 (Approximate)   SpO2 98%   BMI 23.91 kg/m   Physical Exam 46 year old female, resting comfortably and in no acute distress. Vital signs are significant for hypertension. Oxygen saturation is 98%, which is normal. Head is normocephalic and atraumatic. PERRLA, EOMI. Oropharynx is clear. Neck is nontender and supple without adenopathy or JVD. Back is nontender and there is no CVA tenderness. Lungs are clear without rales, wheezes, or rhonchi. Chest is nontender. Heart has regular rate and rhythm without murmur. Abdomen is soft, flat, nontender without masses or hepatosplenomegaly  and peristalsis is normoactive. Extremities have no cyanosis or edema, full range of motion is present. Skin is warm and dry without rash. Neurologic: Mental status is normal, cranial nerves are intact, there are no motor or sensory deficits.  ED Treatments / Results  DIAGNOSTIC STUDIES: Oxygen Saturation is 98% on RA, NL by my interpretation.    COORDINATION OF CARE: 3:04 AM-Discussed next steps with pt. Pt verbalized understanding and is agreeable with the plan. Will order labs and review records.   Labs (all labs ordered are listed, but only abnormal results are displayed) Labs Reviewed  CBC - Abnormal; Notable for the following:       Result Value   Hemoglobin 15.4 (*)    All other components within normal limits  BASIC METABOLIC PANEL - Abnormal; Notable for the following:    Potassium 3.0 (*)    Chloride 100 (*)    CO2 21 (*)  Glucose, Bld 137 (*)    All other components within normal limits  MAGNESIUM  HEPATIC FUNCTION PANEL  I-STAT TROPOININ, ED    EKG  EKG Interpretation  Date/Time:  Tuesday Aug 03 2016 02:15:50 EDT Ventricular Rate:  127 PR Interval:    QRS Duration: 80 QT Interval:  402 QTC Calculation: 584 R Axis:   83 Text Interpretation:  ** Critical Test Result: Long QTc Sinus tachycardia with Premature atrial complexes ST & T wave abnormality, consider inferior ischemia ST & T wave abnormality, consider anterior ischemia Abnormal ECG When compared with ECG of 07/16/2016, QT has lengthened Premature atrial complexes are now present Confirmed by Tracy BoozeGlick, Dhilan Brauer ([id number]) on 08/03/2016 2:28:34 AM      Procedures Procedures (including critical care time)  Medications Ordered in ED Medications  metoprolol tartrate (LOPRESSOR) tablet 25 mg (25 mg Oral Given 08/03/16 0353)  potassium chloride SA (K-DUR,KLOR-CON) CR tablet 40 mEq (40 mEq Oral Given 08/03/16 0353)  metoCLOPramide (REGLAN) tablet 10 mg (10 mg Oral Given 08/03/16 0354)     Initial Impression /  Assessment and Plan / ED Course  I have reviewed the triage vital signs and the nursing notes.  Pertinent lab results that were available during my care of the patient were reviewed by me and considered in my medical decision making (see chart for details).  Gastroesophageal reflux which has been resistant to treatment with proton pump inhibitors and H2 inhibitors. Old records are reviewed, and she was seen in the ED 2 days ago with tachycardia. Her PCP has decreased the dose of metoprolol, which probably accounts for her having tachycardia today. Her gastroenterologist had started her on Dexilant, which she discontinued because of side effects. Actually, the lack of heartburn suggest that she has had adequate acid suppression. She will be given a trial of metoclopramide to increase lower esophageal sphincter pressure. She is scheduled for endoscopy in 2 days, and treatment decisions after that will be decided by her gastroenterologist. Laboratory workup is significant for hypokalemia, and she is given oral potassium and will be sent home with potassium supplements for the next 2 days. Of note, he ECG did show slightly prolonged QT interval and this is probably related to hypokalemia as since magnesium level has come back normal.  Final Clinical Impressions(s) / ED Diagnoses   Final diagnoses:  GERD without esophagitis  Sinus tachycardia  Hypokalemia    New Prescriptions New Prescriptions   METOCLOPRAMIDE (REGLAN) 10 MG TABLET    Take 1 tablet (10 mg total) by mouth 4 (four) times daily -  before meals and at bedtime.   POTASSIUM CHLORIDE SA (K-DUR,KLOR-CON) 20 MEQ TABLET    Take 1 tablet (20 mEq total) by mouth 2 (two) times daily.   I personally performed the services described in this documentation, which was scribed in my presence. The recorded information has been reviewed and is accurate.       Tracy BoozeGlick, Merilynn Haydu, MD 08/03/16 [phone]

## 2016-08-05 ENCOUNTER — Other Ambulatory Visit: Payer: Self-pay | Admitting: Orthopedic Surgery

## 2016-08-05 DIAGNOSIS — S42402D Unspecified fracture of lower end of left humerus, subsequent encounter for fracture with routine healing: Secondary | ICD-10-CM

## 2016-08-06 ENCOUNTER — Ambulatory Visit
Admission: RE | Admit: 2016-08-06 | Discharge: 2016-08-06 | Disposition: A | Discharge status: Home / Self Care | Payer: BC Managed Care – PPO | Source: Ambulatory Visit | Attending: Orthopedic Surgery | Admitting: Orthopedic Surgery

## 2016-08-06 DIAGNOSIS — S42402D Unspecified fracture of lower end of left humerus, subsequent encounter for fracture with routine healing: Secondary | ICD-10-CM

## 2016-08-11 ENCOUNTER — Encounter (INDEPENDENT_AMBULATORY_CARE_PROVIDER_SITE_OTHER): Payer: Self-pay

## 2016-08-11 ENCOUNTER — Ambulatory Visit (INDEPENDENT_AMBULATORY_CARE_PROVIDER_SITE_OTHER): Payer: BC Managed Care – PPO | Admitting: Cardiology

## 2016-08-11 ENCOUNTER — Encounter: Payer: Self-pay | Admitting: Cardiology

## 2016-08-11 VITALS — BP 140/84 | HR 74 | Ht 63.0 in | Wt 130.6 lb

## 2016-08-11 DIAGNOSIS — R000 Tachycardia, unspecified: Secondary | ICD-10-CM

## 2016-08-11 DIAGNOSIS — F419 Anxiety disorder, unspecified: Secondary | ICD-10-CM | POA: Diagnosis not present

## 2016-08-11 MED ORDER — METOPROLOL TARTRATE 25 MG PO TABS: 25.0000 mg | ORAL_TABLET | ORAL | 99 refills | Status: DC | PRN

## 2016-08-11 NOTE — Progress Notes (Signed)
Cardiology Office Note:    Date:  08/11/2016   ID:  TEARSA KOWALEWSKI, DOB September 14, 1970, MRN [id number]  PCP:  Maurice Small, MD  Cardiologist:  Donato Schultz, MD    Referring MD: Maurice Small, MD     History of Present Illness:    Tracy Wade is a 46 y.o. female with Anxiety here for the evaluation of tachycardia at the request of Maurice Small.  She was seen in the emergency department on 07/16/16 with palpitations, smart watch should 132 bpm, anxious. No chest pain, no nausea. She has a history of anxiety. Was napping. Night before had GERD with onion and green peppers. Felt some tightness, reflux.  Smart Watch notified her. Freaked out. He called out to her sons age 71-13 to go get the neighbors. She ended up getting worse and called 911. Blood pressure was 200/100. The fire department came. They checked an EKG and told her that it did not look like she was having a heart attack but felt as though she should go to the emergency room where she saw Dr. Patria Mane. Note reviewed. Metoprolol given which nicely and gradually decreased her heart rate. No associated headaches, no neurologic sequelae, no visual changes.  Back in 2013 she had an evaluation with echocardiogram and exercise treadmill test after she was having episodes of panic-like symptoms, dry mouth.  Went back to ER 2 weeks later. Occasionally when getting to sleep, 10 minutes into this she will wake up abruptly, nervous and her husband tries to calm her down.   Runs often. 6 miles straight.   Endo yesterday OK. Normal EGD. They thought that perhaps her GERD had increased recently because of the new start metoprolol potentially.  Nonsmoker, no caffeine, no Sudafed. She has lost some weight over the last month. She did mention that she was concerned about that.  Chest x-ray personally reviewed from the ER was normal.  Past Medical History:  Diagnosis Date  . Anxiety   . Fall from horse 09/29/2015   injury to  brachial artery  . GERD (gastroesophageal reflux disease)   . Hypertension     Past Surgical History:  Procedure Laterality Date  . ARTERY REPAIR Left 09/29/2015   Procedure: TRANSVERSE BRACHIAL ARTERY REPAIR;  Surgeon: Larina Earthly, MD;  Location: [facility] OR;  Service: Vascular;  Laterality: Left;  . CESAREAN SECTION     and numerous other gynecological procedures  . CLOSED REDUCTION ELBOW FRACTURE Left 09/29/2015   Procedure: CLOSED REDUCTION ELBOW;  Surgeon: Durene Romans, MD;  Location: [facility] OR;  Service: Orthopedics;  Laterality: Left;  . ENDOVEIN HARVEST OF GREATER SAPHENOUS VEIN  09/29/2015   Procedure: REVERSE GREATER SAPHENOUS VEIN;  Surgeon: Larina Earthly, MD;  Location: [facility] OR;  Service: Vascular;;  . I&D EXTREMITY Left 09/29/2015   Procedure: IRRIGATION AND DEBRIDEMENT,  LEFT ELBOW;  Surgeon: Durene Romans, MD;  Location: [facility] OR;  Service: Orthopedics;  Laterality: Left;  . I&D EXTREMITY Left 10/01/2015   Procedure: IRRIGATION AND DEBRIDEMENT LEFT ELBOW;  Surgeon: Bradly Bienenstock, MD;  Location: [facility] OR;  Service: Orthopedics;  Laterality: Left;  left elbow  . I&D EXTREMITY Left 10/03/2015   Procedure: IRRIGATION AND DEBRIDEMENT LEFT ELBOW;  Surgeon: Bradly Bienenstock, MD;  Location: [facility] OR;  Service: Orthopedics;  Laterality: Left;  . I&D EXTREMITY Left 10/05/2015   Procedure: LEFT ELBOW IRRIGATION AND DEBRIDEMENT, OPEN REDUCTION INTERNAL FIXATION AND REPAIR;  Surgeon: Bradly Bienenstock, MD;  Location: [facility] OR;  Service: Orthopedics;  Laterality: Left;  .  MYOMECTOMY     fibroid tumor    Current Medications: Current Meds  Medication Sig  . Coenzyme Q10 (CO Q 10 PO) Take 1 capsule by mouth daily.  . fluticasone (FLONASE) 50 MCG/ACT nasal spray Place 1 spray into both nostrils daily as needed for allergies or rhinitis.  . Omega-3 Fatty Acids (FISH OIL) 1000 MG CAPS Take 1 capsule by mouth daily.     Allergies:   Erythromycin; Gentamicin; and Sulfa antibiotics   Social History   Social History  . Marital  status: Married    Spouse name: N/A  . Number of children: N/A  . Years of education: N/A   Social History Main Topics  . Smoking status: Never Smoker  . Smokeless tobacco: Never Used  . Alcohol use Yes     Comment: rare glass of wine  . Drug use: No  . Sexual activity: Yes   Other Topics Concern  . 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     Family History: The patient's family history includes Heart attack (age of onset: 4350) in her father; Heart failure in her father; Stroke in her father. ROS:   Please see the history of present illness.     All other systems reviewed and are negative.  EKGs/Labs/Other Studies Reviewed:    The following studies were reviewed today: ER visit, lab work, chest x-ray, EKG reviewed  EKG: Prior EKGs personally reviewed showing sinus tachycardia with rare PAC, nonspecific ST-T wave changes, otherwise normal.  Recent Labs: 09/29/2015: ALT 20 07/17/2016: TSH 2.115 08/03/2016: BUN 13; Creatinine, Ser 0.97; Hemoglobin 15.4; Magnesium 2.4; Platelets 276; Potassium 3.0; Sodium 135   Recent Lipid Panel No results found for: CHOL, TRIG, HDL, CHOLHDL, VLDL, LDLCALC, LDLDIRECT  Physical Exam:    VS:  BP 140/84   Pulse 74   Ht 5\' 3"  (1.6 m)   Wt 130 lb 9.6 oz (59.2 kg)   LMP 07/26/2016 (Approximate)   BMI 23.13 kg/m     Wt Readings from Last 3 Encounters:  08/11/16 130 lb 9.6 oz (59.2 kg)  08/03/16 135 lb (61.2 kg)  10/15/15 143 lb 12.8 oz (65.2 kg)     GEN:  Well nourished, well developed in no acute distress HEENT: Normal NECK: No JVD; No carotid bruits LYMPHATICS: No lymphadenopathy CARDIAC: RRR, no murmurs, rubs, gallops RESPIRATORY:  Clear to auscultation without rales, wheezing or rhonchi  ABDOMEN: Soft, non-tender, non-distended MUSCULOSKELETAL:  No edema; No deformity  SKIN: Warm and dry NEUROLOGIC:  Alert and oriented x  3 PSYCHIATRIC:  Normal affect   ASSESSMENT:    1. Tachycardia   2. Anxiety    PLAN:    In order of problems listed above:  Palpitations/sinus tachycardia/anxiety  - It appears that her bouts of increased heart rate and increased blood pressure are likely anxiety driven. She showed me a list of her blood pressures at home and heart rates for a while and they were reassuring.  - I agree with utilization of current techniques to help decrease stress, anxiety  - I would recommend utilizing metoprolol on when necessary basis. She has not taken today and her heart rate is excellent.  - Prior workup in 2013 in the setting of what sounded like panic attacks was reassuring with normal echocardiogram, normal structure and function of her heart as well as normal exercise treadmill test. I do  not think that we need to proceed with any further cardiac testing.  - TSH is normal.  - We discussed the possibility of checking 24-hour urine catecholamines however the symptoms have gone back as far as 2013 at one point and I do not feel strongly about proceeding with this testing.  - She has had success training for a marathon relay and enjoys running without any cardiac symptoms.  No further cardiac workup.  We will be happy to see her back if necessary.  Medication Adjustments/Labs and Tests Ordered: Current medicines are reviewed at length with the patient today.  Concerns regarding medicines are outlined above. Labs and tests ordered and medication changes are outlined in the patient instructions below:  Patient Instructions  Medication Instructions:  You may take Metoprolol 25 mg as needed. Continue all other medications as listed.  Follow-Up: Follow up as needed with Dr Anne Fu.  Thank you for choosing [facility]!!          Signed, Donato Schultz, MD  08/11/2016 5:19 PM    [facility]

## 2016-08-11 NOTE — Patient Instructions (Addendum)
Medication Instructions:  You may take Metoprolol 25 mg as needed. Continue all other medications as listed.  Follow-Up: Follow up as needed with Dr Anne FuSkains.  Thank you for choosing [facility]!!

## 2016-08-13 ENCOUNTER — Telehealth: Payer: Self-pay | Admitting: Emergency Medicine

## 2016-08-13 NOTE — Telephone Encounter (Signed)
    Patient called concerned about her heart of 89 while sitting down to rest. She wears

## 2016-09-09 ENCOUNTER — Other Ambulatory Visit: Payer: Self-pay | Admitting: Gastroenterology

## 2016-09-27 ENCOUNTER — Encounter (HOSPITAL_COMMUNITY): Admission: RE | Payer: Self-pay | Source: Ambulatory Visit

## 2016-09-27 ENCOUNTER — Ambulatory Visit (HOSPITAL_COMMUNITY)
Admission: RE | Admit: 2016-09-27 | Payer: BC Managed Care – PPO | Source: Ambulatory Visit | Admitting: Gastroenterology

## 2016-09-27 SURGERY — MANOMETRY, ESOPHAGUS

## 2017-09-22 IMAGING — CT CT HEAD W/O CM
5 of 8 series · 20 of 47 positions shown, 22 images · non-contrast
Comparison: None.

CLINICAL DATA: Post fall from horse

EXAM:
CT HEAD WITHOUT CONTRAST
CT CERVICAL SPINE WITHOUT CONTRAST
TECHNIQUE: Multidetector CT imaging of the head and cervical spine was
performed following the standard protocol without intravenous
contrast. Multiplanar CT image reconstructions of the cervical spine
were also generated.

[Series 201: head w/o, idose (1) · axial · non-contrast · 0.40mm/px · z∈[+84,+134]mm · 2 of 32 slices shown]
[im 11/32  brain]
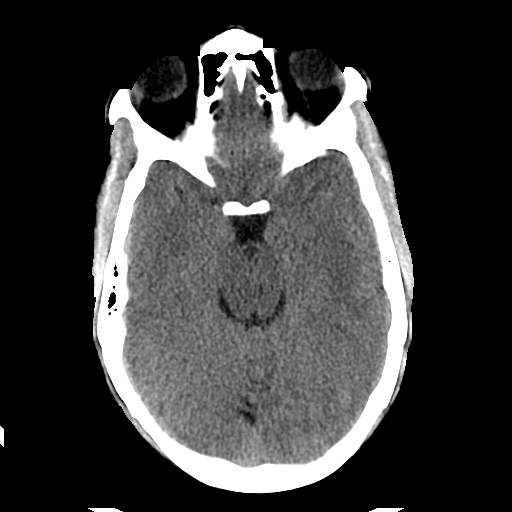
[im 21/32  brain]
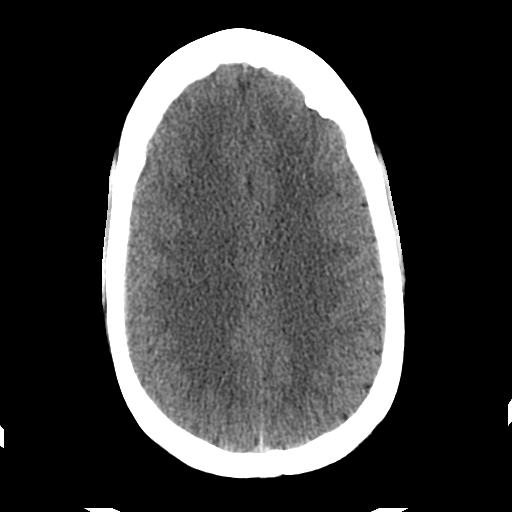

[Series 203: coronal st, idose (1) · coronal · 0.40mm/px · 3 of 65 slices shown]
[im 25/65  brain]
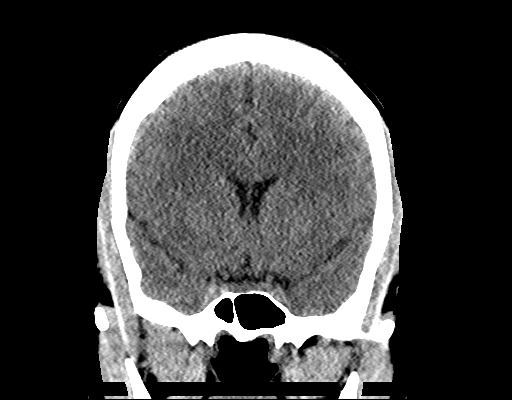
[im 33/65  brain]
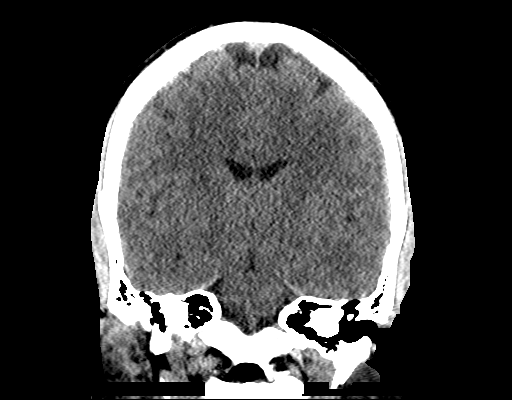
[im 41/65  brain]
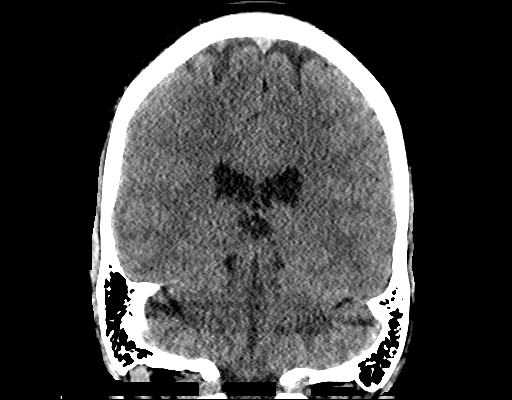

[Series 204: sagittal st, idose (1) · sagittal · 0.40mm/px · 2 of 65 slices shown]
[im 22/65  brain]
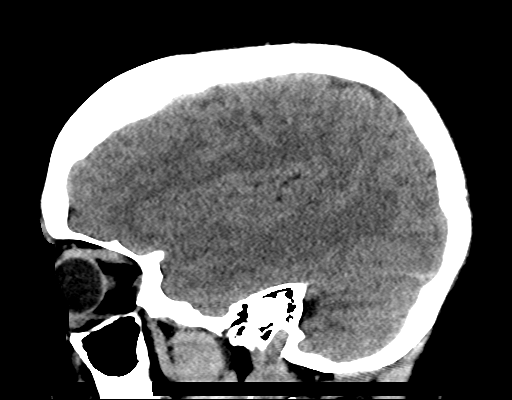
[im 43/65  brain]
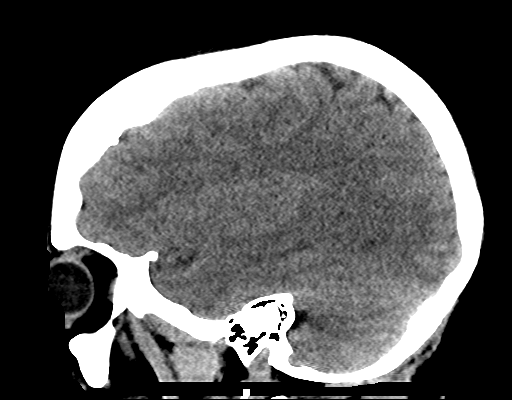

[Series 302: soft tissue, idose (2) · axial · 0.27mm/px · z∈[-77,+37]mm · 7 of 77 slices shown, 9 images]
[im 10/77  brain]
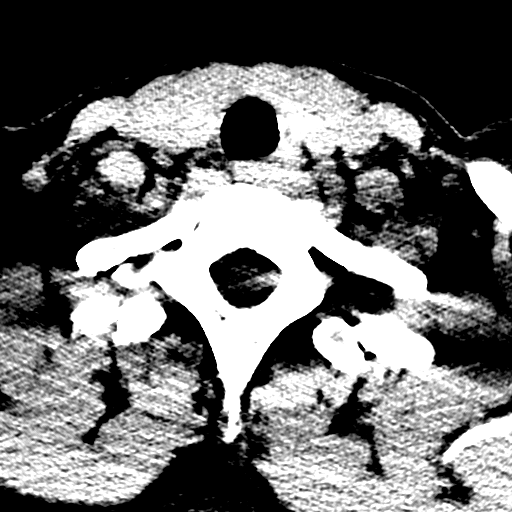
[im 10/77  bone]
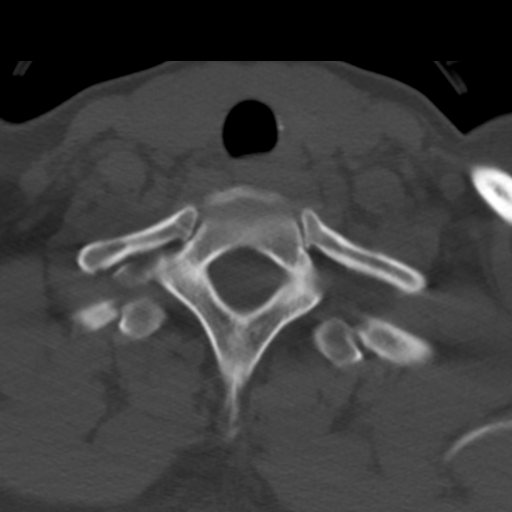
[im 20/77  brain]
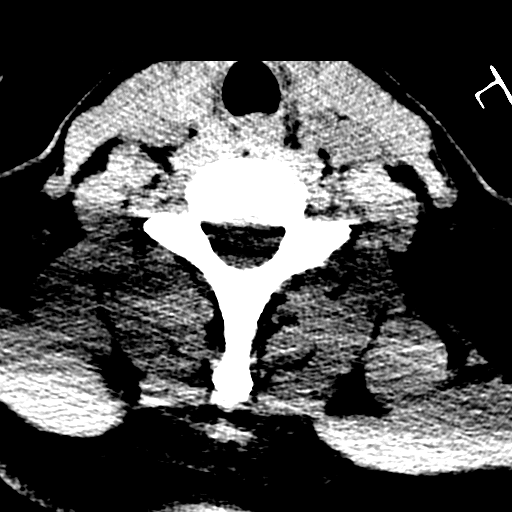
[im 29/77  brain]
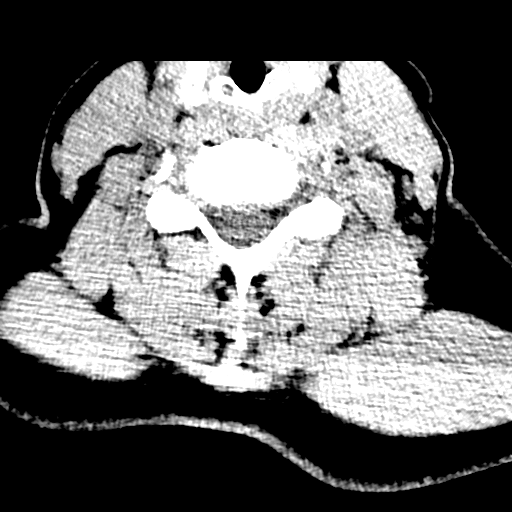
[im 39/77  brain]
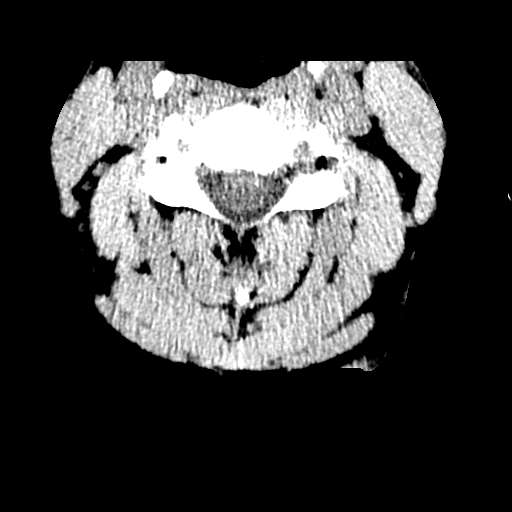
[im 48/77  brain]
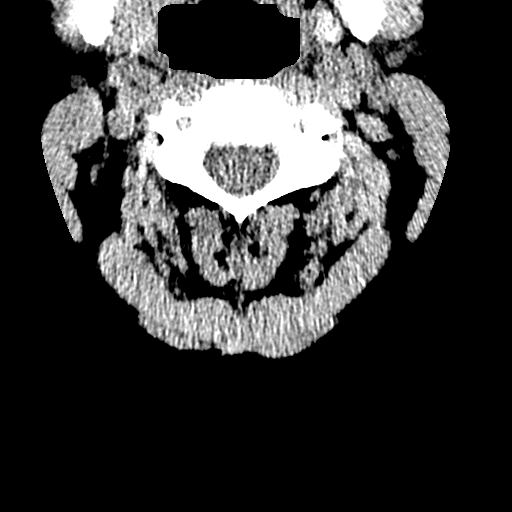
[im 48/77  bone]
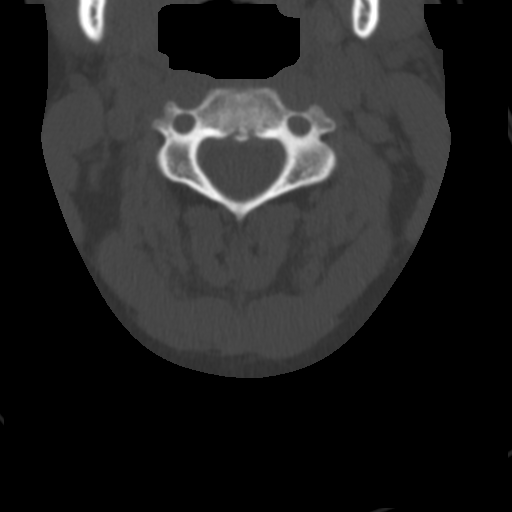
[im 58/77  brain]
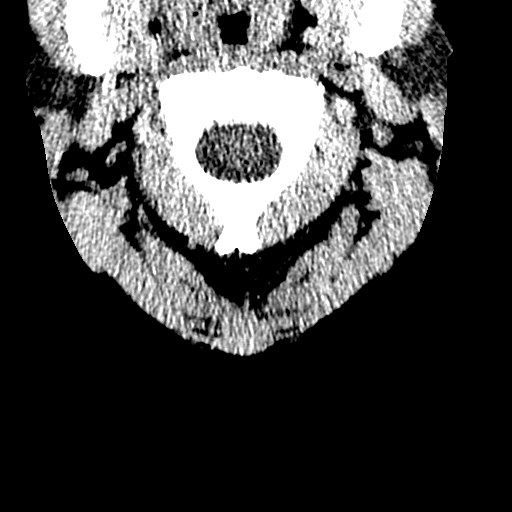
[im 67/77  brain]
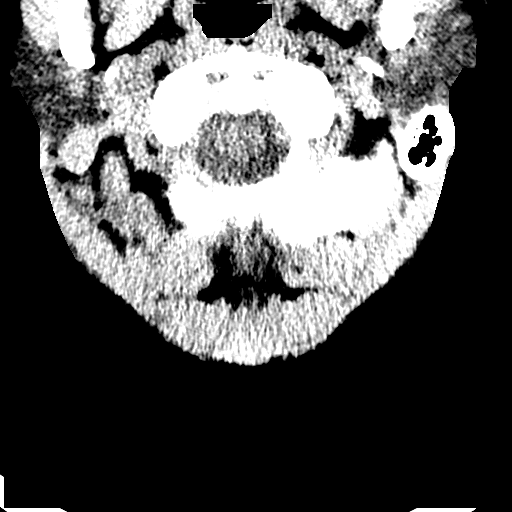

[Series 308: orthogonals, idose (2) · axial · 0.27mm/px · z∈[-83,+15]mm · 6 of 72 slices shown]
[im 11/72  brain]
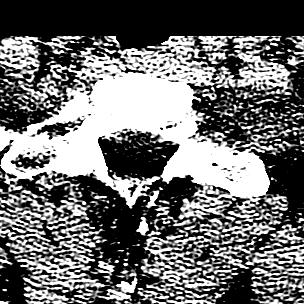
[im 21/72  brain]
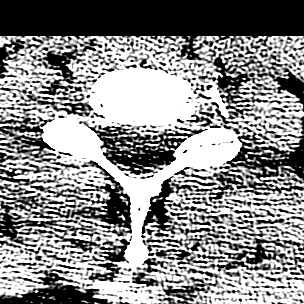
[im 31/72  brain]
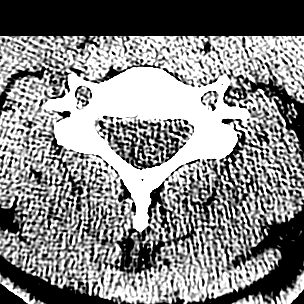
[im 41/72  brain]
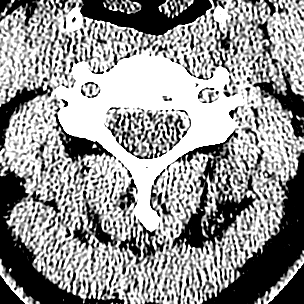
[im 51/72  brain]
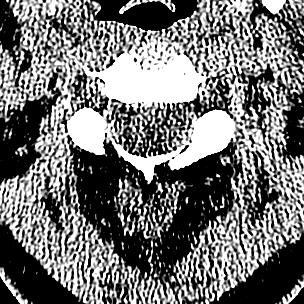
[im 61/72  brain]
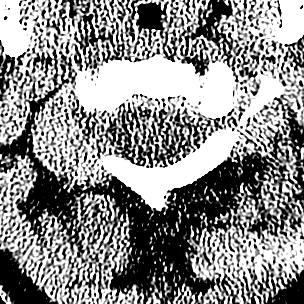

[20 of 47 positions shown; findings below may reference images not displayed]

FINDINGS: CT HEAD FINDINGS

Regional soft tissues appear normal. No displaced calvarial
fracture. No radiopaque foreign body.

Gray-white differentiation is maintained. No CT evidence acute large
territory infarct. No intraparenchymal or extra-axial mass or
hemorrhage. Normal size and configuration of the ventricles and
basilar cisterns. No midline shift.

Limited visualization the paranasal sinuses and mastoid air cells is
normal. No air-fluid levels.

CT CERVICAL SPINE FINDINGS

C1 to the superior endplate of T1 is imaged.

Normal alignment of the cervical spine. No anterolisthesis or
retrolisthesis. The dens is normally positioned between the lateral
masses of C1. Normal atlantodental and atlantoaxial articulations.
The bilateral facets are normally aligned.

No fracture or static subluxation of the cervical spine. Cervical
vertebral body heights are preserved. Prevertebral soft tissues are
normal.

Mild multilevel cervical spine DDD, worse at C5-C6 with disc space
height loss, endplate irregularity and minimal amount of anteriorly
directed disc osteophyte ptosis with associated small limbus bodies
involving the anterior aspect of the inferior endplate of the C5
vertebral body.

Regional soft tissues appear normal. No bulky cervical
lymphadenopathy on this noncontrast examination. Normal noncontrast
appearance of the thyroid gland. Limited visualization of the lung
apices is normal.
IMPRESSION: 1. Negative noncontrast head CT.
2. No fracture or static subluxation of the cervical spine.
3. Mild multilevel cervical spine DDD, worse at C5-C6.
Critical Value/emergent results were discussed in person at the time
of interpretation on 09/29/2015 at [DATE] to Dr. Nie, who
verbally acknowledged these results.

## 2018-10-12 ENCOUNTER — Other Ambulatory Visit: Payer: Self-pay | Admitting: Obstetrics and Gynecology

## 2018-10-12 DIAGNOSIS — R928 Other abnormal and inconclusive findings on diagnostic imaging of breast: Secondary | ICD-10-CM

## 2018-10-16 ENCOUNTER — Ambulatory Visit
Admission: RE | Admit: 2018-10-16 | Discharge: 2018-10-16 | Disposition: A | Discharge status: Home / Self Care | Payer: BC Managed Care – PPO | Source: Ambulatory Visit | Attending: Obstetrics and Gynecology | Admitting: Obstetrics and Gynecology

## 2018-10-16 ENCOUNTER — Other Ambulatory Visit: Payer: Self-pay

## 2018-10-16 ENCOUNTER — Ambulatory Visit: Payer: BC Managed Care – PPO

## 2018-10-16 ENCOUNTER — Other Ambulatory Visit: Payer: Self-pay | Admitting: Obstetrics and Gynecology

## 2018-10-16 DIAGNOSIS — N6489 Other specified disorders of breast: Secondary | ICD-10-CM

## 2018-10-16 DIAGNOSIS — R928 Other abnormal and inconclusive findings on diagnostic imaging of breast: Secondary | ICD-10-CM

## 2018-10-18 ENCOUNTER — Other Ambulatory Visit: Payer: Self-pay

## 2018-10-18 ENCOUNTER — Other Ambulatory Visit: Payer: Self-pay | Admitting: Obstetrics and Gynecology

## 2018-10-18 ENCOUNTER — Ambulatory Visit
Admission: RE | Admit: 2018-10-18 | Discharge: 2018-10-18 | Disposition: A | Discharge status: Home / Self Care | Payer: BC Managed Care – PPO | Source: Ambulatory Visit | Attending: Obstetrics and Gynecology | Admitting: Obstetrics and Gynecology

## 2018-10-18 DIAGNOSIS — N6489 Other specified disorders of breast: Secondary | ICD-10-CM

## 2019-01-27 ENCOUNTER — Other Ambulatory Visit: Payer: Self-pay

## 2019-01-27 DIAGNOSIS — Z20822 Contact with and (suspected) exposure to covid-19: Secondary | ICD-10-CM

## 2019-01-29 LAB — NOVEL CORONAVIRUS, NAA: SARS-CoV-2, NAA: NOT DETECTED

## 2019-03-09 HISTORY — PX: COLONOSCOPY: SHX174

## 2019-04-23 ENCOUNTER — Other Ambulatory Visit: Payer: Self-pay

## 2019-04-23 ENCOUNTER — Ambulatory Visit
Admission: RE | Admit: 2019-04-23 | Discharge: 2019-04-23 | Disposition: A | Discharge status: Home / Self Care | Payer: BC Managed Care – PPO | Source: Ambulatory Visit | Attending: Obstetrics and Gynecology | Admitting: Obstetrics and Gynecology

## 2019-04-23 DIAGNOSIS — N6489 Other specified disorders of breast: Secondary | ICD-10-CM

## 2020-05-26 NOTE — Progress Notes (Signed)
New Patient Note  RE: Tracy Wade MRN: [id number] DOB: 26-Feb-1971 Date of Office Visit: 05/27/2020  Referring provider: Maurice Small, MD Primary care provider: Maurice Small, MD  Chief Complaint: allergy (Eye swelling, itchy eyes 4 times since last Fall)  History of Present Illness: I had the pleasure of seeing Tessie Eke for initial evaluation at the [facility] on 05/27/2020. She is a 50 y.o. female, who is referred here by Maurice Small, MD for the evaluation of itchy eyes.  She reports symptoms of itchy/watery eyes, periorbital swelling, sneezing, itchy throat/ears. It usually takes a few days for symptoms to completely resolve. She had 4 total episodes of this since the fall 2021. Last episode was in February 2022 and had no contact with the dog during this time.   Patient got a new dog in May 2021 - labradoodle.   Other triggers include exposure to unknown. Denies any changes in diet, meds, personal care products. Anosmia: no. Headache: no. She has used benadryl, pataday prn, Zaditor prn. with some improvement in symptoms. Zyrtec causes drowsiness. Previous work up includes: none. Previous ENT evaluation: no. Previous sinus imaging: no. History of nasal polyps: no. Last eye exam: within the past year. History of reflux: yes and takes meds prn. Patient does wear contacts.   Reviewed images on the phone - bilateral eye swelling.   Assessment and Plan: Trynity is a 50 y.o. female with: Allergic conjunctivitis of both eyes 4 episodes of itchy/watery eyes with periorbital swelling. Has some underlying rhinitis symptoms as well and takes OTC antihistamines, eye drops with good benefit. Zyrtec caused drowsiness. New dog since May 2021.   Today's skin testing showed: Positive to grass, ragweed, weed, trees, mold, dust mites, dog and cockroach.  The above allergens are most likely the trigger for her symptoms.   Start environmental  control measures as below.  May use over the counter antihistamines such as Claritin (loratadine), Allegra (fexofenadine), or Xyzal (levocetirizine) daily. May take twice a day if needed.   May use olopatadine eye drops 0.2% once a day as needed for itchy/watery eyes.  May use Flonase (fluticasone) nasal spray 1 spray per nostril twice a day as needed for nasal congestion.   Discussed starting allergy immunotherapy - handout given.   Other allergic rhinitis  See assessment and plan as above.  Adverse reaction to food, subsequent encounter Consuming too much tomato products cause sneezing and nasal congestion.  Today's skin testing was negative to tomato.  Limit tomato intake.   Return in about 3 months (around 08/27/2020).  Meds ordered this encounter  Medications  . Olopatadine HCl 0.2 % SOLN    Sig: Apply 1 drop to eye daily as needed (itchy/watery eyes).    Dispense:  2.5 mL    Refill:  5   Lab Orders  No laboratory test(s) ordered today    Other allergy screening: Asthma: no Food allergy:  Increased tomato products causes some sneezing, nasal congestion. Medication allergy: yes Hymenoptera allergy: no Urticaria: no Eczema:no History of recurrent infections suggestive of immunodeficency: no  Diagnostics: Skin Testing: Environmental allergy panel and select foods. Positive to grass, ragweed, weed, trees, mold, dust mites, dog and cockroach. Negative to tomato Results discussed with patient/family.  Airborne Adult Perc - 05/27/20 1543    Time Antigen Placed 1540    Allergen Manufacturer Waynette Buttery    Location Back    Number of Test 59    1. Control-Buffer 50% Glycerol Negative  2. Control-Histamine 1 mg/ml 2+    3. Albumin saline Negative    4. Bahia Negative    5. French Southern Territories Negative    6. Johnson 3+    7. Kentucky Blue 2+    8. Meadow Fescue 2+    9. Perennial Rye 2+    10. Sweet Vernal 2+    11. Timothy 4+    12. Cocklebur Negative    13. Burweed  Marshelder Negative    14. Ragweed, short 3+    15. Ragweed, Giant Negative    16. Plantain,  English Negative    17. Lamb's Quarters Negative    18. Sheep Sorrell Negative    19. Rough Pigweed Negative    20. Marsh Elder, Rough Negative    21. Mugwort, Common Negative    22. Ash mix 3+    23. Birch mix Negative    24. Van Clines American --   +/-   25. Box, Elder Negative    26. Cedar, red 2+    27. Cottonwood, Guinea-Bissau Negative    28. Elm mix 2+    29. Hickory 2+    30. Maple mix Negative    31. Oak, Guinea-Bissau mix 2+    32. Pecan Pollen 2+    33. Pine mix Negative    34. Sycamore Eastern Negative    35. Walnut, Black Pollen Negative    36. Alternaria alternata 4+    37. Cladosporium Herbarum --   +/-   38. Aspergillus mix Negative    39. Penicillium mix 2+    40. Bipolaris sorokiniana (Helminthosporium) 4+    41. Drechslera spicifera (Curvularia) 4+    42. Mucor plumbeus 2+    43. Fusarium moniliforme 2+    44. Aureobasidium pullulans (pullulara) Negative    45. Rhizopus oryzae Negative    46. Botrytis cinera Negative    47. Epicoccum nigrum 2+    48. Phoma betae 2+    49. Candida Albicans 2+    50. Trichophyton mentagrophytes 2+    51. Mite, D Farinae  5,000 AU/ml 2+    52. Mite, D Pteronyssinus  5,000 AU/ml 2+    53. Cat Hair 10,000 BAU/ml Negative    54.  Dog Epithelia Negative    55. Mixed Feathers Negative    56. Horse Epithelia Negative    57. Cockroach, German Negative    58. Mouse Negative    59. Tobacco Leaf Negative          Intradermal - 05/27/20 1557    Time Antigen Placed 1600    Allergen Manufacturer Waynette Buttery    Location Arm    Number of Test 6    Control Negative    Weed mix 2+    Mold 2 2+    Cat Negative    Dog 4+    Cockroach 2+          Food Adult Perc - 05/27/20 1600    Time Antigen Placed 1540    Allergen Manufacturer Waynette Buttery    Location Back    Number of allergen test 1    42. Tomato Negative           Past Medical  History: Patient Active Problem List   Diagnosis Date Noted  . Allergic conjunctivitis of both eyes 05/27/2020  . Other allergic rhinitis 05/27/2020  . Adverse reaction to food, subsequent encounter 05/27/2020  . 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 Fall from horse 09/30/2015  . Open  fracture dislocation of left elbow joint 09/29/2015  . Stress at work 05/30/2011  . Anxiety with somatization 05/30/2011  . Nonspecific abnormal electrocardiogram (ECG) (EKG) 05/30/2011  . White coat hypertension 05/30/2011   Past Medical History:  Diagnosis Date  . Angio-edema   . Anxiety   . Eczema   . Fall from horse 09/29/2015   injury to brachial artery  . GERD (gastroesophageal reflux disease)   . Hypertension    Past Surgical History: Past Surgical History:  Procedure Laterality Date  . ARTERY REPAIR Left 09/29/2015   Procedure: TRANSVERSE BRACHIAL ARTERY REPAIR;  Surgeon: Larina Earthly, MD;  Location: [facility] OR;  Service: Vascular;  Laterality: Left;  . CESAREAN SECTION     and numerous other gynecological procedures  . CLOSED REDUCTION ELBOW FRACTURE Left 09/29/2015   Procedure: CLOSED REDUCTION ELBOW;  Surgeon: Durene Romans, MD;  Location: [facility] OR;  Service: Orthopedics;  Laterality: Left;  . ENDOVEIN HARVEST OF GREATER SAPHENOUS VEIN  09/29/2015   Procedure: REVERSE GREATER SAPHENOUS VEIN;  Surgeon: Larina Earthly, MD;  Location: [facility] OR;  Service: Vascular;;  . I & D EXTREMITY Left 09/29/2015   Procedure: IRRIGATION AND DEBRIDEMENT,  LEFT ELBOW;  Surgeon: Durene Romans, MD;  Location: [facility] OR;  Service: Orthopedics;  Laterality: Left;  . I & D EXTREMITY Left 10/01/2015   Procedure: IRRIGATION AND DEBRIDEMENT LEFT ELBOW;  Surgeon: Bradly Bienenstock, MD;  Location: [facility] OR;  Service: Orthopedics;  Laterality: Left;  left elbow  . I & D EXTREMITY Left 10/03/2015   Procedure: IRRIGATION AND DEBRIDEMENT LEFT ELBOW;  Surgeon: Bradly Bienenstock, MD;  Location: [facility] OR;  Service: Orthopedics;  Laterality: Left;  . I &  D EXTREMITY Left 10/05/2015   Procedure: LEFT ELBOW IRRIGATION AND DEBRIDEMENT, OPEN REDUCTION INTERNAL FIXATION AND REPAIR;  Surgeon: Bradly Bienenstock, MD;  Location: [facility] OR;  Service: Orthopedics;  Laterality: Left;  . MYOMECTOMY     fibroid tumor   Medication List:  Current Outpatient Medications  Medication Sig Dispense Refill  . amLODipine (NORVASC) 10 MG tablet Take 10 mg by mouth daily.    . fluticasone (FLONASE) 50 MCG/ACT nasal spray Place 1 spray into both nostrils daily as needed for allergies or rhinitis.    Marland Kitchen levothyroxine (SYNTHROID) 25 MCG tablet Take 25 mcg by mouth every morning.    Marland Kitchen levothyroxine (SYNTHROID) 75 MCG tablet Take 75 mcg by mouth every morning.    . Olopatadine HCl 0.2 % SOLN Apply 1 drop to eye daily as needed (itchy/watery eyes). 2.5 mL 5  . Pediatric Multivit-Minerals-C (GUMMI BEAR MULTIVITAMIN/MIN) CHEW     . sertraline (ZOLOFT) 50 MG tablet Take 50 mg by mouth daily.     No current facility-administered medications for this visit.   Allergies: Allergies  Allergen Reactions  . Erythromycin Nausea And Vomiting  . Gentamicin Other (See Comments)    Had hallucinations during infusion of extended interval once daily gent; resolved with stopping infusion  . Sulfa Antibiotics Rash   Social History: Social History   Socioeconomic History  . Marital status: Married    Spouse name: Not on file  . Number of children: Not on file  . Years of education: Not on file  . Highest education level: Not on file  Occupational History  . Not on file  Tobacco Use  . Smoking status: Never Smoker  . Smokeless tobacco: Never Used  Vaping Use  . Vaping Use: Never used  Substance and Sexual Activity  . Alcohol use:  Yes    Comment: rare glass of wine  . Drug use: No  . Sexual activity: Yes  Other Topics Concern  . 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   Social  Determinants of Health   Financial Resource Strain: Not on file  Food Insecurity: Not on file  Transportation Needs: Not on file  Physical Activity: Not on file  Stress: Not on file  Social Connections: Not on file   Lives in a house which is 51 years old. Smoking: denies Occupation: Warehouse managerspeech language pathologist  Environmental History: Water Damage/mildew in the house: yes Carpet in the family room: no Carpet in the bedroom: yes Heating: gas Cooling: central Pet: yes 1 dog x <1 yr  Family History: Family History  Problem Relation Age of Onset  . Heart failure Father   . Heart attack Father [id number]  . Stroke Father   . Allergic rhinitis Mother   . Asthma Neg Hx   . Eczema Neg Hx   . Urticaria Neg Hx    Problem                               Relation Asthma                                   No  Eczema                                Son  Food allergy                          No  Allergic rhino conjunctivitis     Mother   Review of Systems  Constitutional: Negative for appetite change, chills, fever and unexpected weight change.  HENT: Negative for congestion and rhinorrhea.   Eyes: Positive for itching.  Respiratory: Negative for cough, chest tightness, shortness of breath and wheezing.   Cardiovascular: Negative for chest pain.  Gastrointestinal: Negative for abdominal pain.  Genitourinary: Negative for difficulty urinating.  Skin: Negative for rash.  Allergic/Immunologic: Positive for environmental allergies.  Neurological: Negative for headaches.   Objective: BP 118/78 (BP Location: Left Arm, Patient Position: Sitting, Cuff Size: Normal)   Pulse 98   Temp (!) 97.3 F (36.3 C) (Temporal)   Ht 5' 3.2" (1.605 m)   Wt 158 lb 8 oz (71.9 kg)   SpO2 100%   BMI 27.90 kg/m  Body mass index is 27.9 kg/m. Physical Exam Vitals and nursing note reviewed.  Constitutional:      Appearance: Normal appearance. She is well-developed.  HENT:     Head: Normocephalic and  atraumatic.     Right Ear: Tympanic membrane and external ear normal.     Left Ear: Tympanic membrane and external ear normal.     Nose: Nose normal.     Mouth/Throat:     Mouth: Mucous membranes are moist.     Pharynx: Oropharynx is clear.  Eyes:     Conjunctiva/sclera: Conjunctivae normal.  Cardiovascular:     Rate and Rhythm: Normal rate and regular rhythm.     Heart sounds: Normal heart sounds. No murmur heard. No friction rub. No  gallop.   Pulmonary:     Effort: Pulmonary effort is normal.     Breath sounds: Normal breath sounds. No wheezing, rhonchi or rales.  Musculoskeletal:     Cervical back: Neck supple.  Skin:    General: Skin is warm.     Findings: No rash.  Neurological:     Mental Status: She is alert and oriented to person, place, and time.  Psychiatric:        Behavior: Behavior normal.    The plan was reviewed with the patient/family, and all questions/concerned were addressed.  It was my pleasure to see [place] today and participate in her care. Please feel free to contact me with any questions or concerns.  Sincerely,  Wyline Mood, DO Allergy & Immunology  [facility] office: [phone] [facility] office: [phone]

## 2020-05-27 ENCOUNTER — Other Ambulatory Visit: Payer: Self-pay

## 2020-05-27 ENCOUNTER — Ambulatory Visit: Payer: BC Managed Care – PPO | Admitting: Allergy

## 2020-05-27 ENCOUNTER — Encounter: Payer: Self-pay | Admitting: Allergy

## 2020-05-27 VITALS — BP 118/78 | HR 98 | Temp 97.3°F | Ht 63.2 in | Wt 158.5 lb

## 2020-05-27 DIAGNOSIS — J3089 Other allergic rhinitis: Secondary | ICD-10-CM | POA: Diagnosis not present

## 2020-05-27 DIAGNOSIS — T781XXD Other adverse food reactions, not elsewhere classified, subsequent encounter: Secondary | ICD-10-CM | POA: Diagnosis not present

## 2020-05-27 DIAGNOSIS — H1013 Acute atopic conjunctivitis, bilateral: Secondary | ICD-10-CM | POA: Diagnosis not present

## 2020-05-27 MED ORDER — OLOPATADINE HCL 0.2 % OP SOLN: 1.0000 [drp] | Freq: Every day | OPHTHALMIC | 5 refills | Status: AC | PRN

## 2020-05-27 NOTE — Assessment & Plan Note (Signed)
4 episodes of itchy/watery eyes with periorbital swelling. Has some underlying rhinitis symptoms as well and takes OTC antihistamines, eye drops with good benefit. Zyrtec caused drowsiness. New dog since May 2021.   Today's skin testing showed: Positive to grass, ragweed, weed, trees, mold, dust mites, dog and cockroach.  The above allergens are most likely the trigger for her symptoms.   Start environmental control measures as below.  May use over the counter antihistamines such as Claritin (loratadine), Allegra (fexofenadine), or Xyzal (levocetirizine) daily. May take twice a day if needed.   May use olopatadine eye drops 0.2% once a day as needed for itchy/watery eyes.  May use Flonase (fluticasone) nasal spray 1 spray per nostril twice a day as needed for nasal congestion.   Discussed starting allergy immunotherapy - handout given.

## 2020-05-27 NOTE — Assessment & Plan Note (Signed)
·   See assessment and plan as above.

## 2020-05-27 NOTE — Assessment & Plan Note (Signed)
Consuming too much tomato products cause sneezing and nasal congestion.  Today's skin testing was negative to tomato.  Limit tomato intake.

## 2020-05-27 NOTE — Patient Instructions (Addendum)
Today's skin testing showed: Positive to grass, ragweed, weed, trees, mold, dust mites, dog and cockroach. Negative to tomato  Environmental allergies  Start environmental control measures as below.  May use over the counter antihistamines such as Claritin (loratadine), Allegra (fexofenadine), or Xyzal (levocetirizine) daily. May take twice a day if needed.   May use olopatadine eye drops 0.2% once a day as needed for itchy/watery eyes.  Wait 10-15 minutes until you put your contacts back in.  May use Flonase (fluticasone) nasal spray 1 spray per nostril twice a day as needed for nasal congestion.   Had a detailed discussion with patient/family that clinical history is suggestive of allergic rhinitis, and may benefit from allergy immunotherapy (AIT). Discussed in detail regarding the dosing, schedule, side effects (mild to moderate local allergic reaction and rarely systemic allergic reactions including anaphylaxis), and benefits (significant improvement in nasal symptoms, seasonal flares of asthma) of immunotherapy with the patient. There is significant time commitment involved with allergy shots, which includes weekly immunotherapy injections for first 9-12 months and then biweekly to monthly injections for 3-5 years.   Read about allergy injections.   Follow up in 3 months or sooner if needed.   Reducing Pollen Exposure . Pollen seasons: trees (spring), grass (summer) and ragweed/weeds (fall). Marland Kitchen Keep windows closed in your home and car to lower pollen exposure.  Lilian Kapur air conditioning in the bedroom and throughout the house if possible.  . Avoid going out in dry windy days - especially early morning. . Pollen counts are highest between 5 - 10 AM and on dry, hot and windy days.  . Save outside activities for late afternoon or after a heavy rain, when pollen levels are lower.  . Avoid mowing of grass if you have grass pollen allergy. Marland Kitchen Be aware that pollen can also be transported  indoors on people and pets.  . Dry your clothes in an automatic dryer rather than hanging them outside where they might collect pollen.  . Rinse hair and eyes before bedtime. Pet Allergen Avoidance: . Contrary to popular opinion, there are no "hypoallergenic" breeds of dogs or cats. That is because people are not allergic to an animal's hair, but to an allergen found in the animal's saliva, dander (dead skin flakes) or urine. Pet allergy symptoms typically occur within minutes. For some people, symptoms can build up and become most severe 8 to 12 hours after contact with the animal. People with severe allergies can experience reactions in public places if dander has been transported on the pet owners' clothing. Marland Kitchen Keeping an animal outdoors is only a partial solution, since homes with pets in the yard still have higher concentrations of animal allergens. . Before getting a pet, ask your allergist to determine if you are allergic to animals. If your pet is already considered part of your family, try to minimize contact and keep the pet out of the bedroom and other rooms where you spend a great deal of time. . As with dust mites, vacuum carpets often or replace carpet with a hardwood floor, tile or linoleum. . High-efficiency particulate air (HEPA) cleaners can reduce allergen levels over time. . While dander and saliva are the source of cat and dog allergens, urine is the source of allergens from rabbits, hamsters, mice and [place] pigs; so ask a non-allergic family member to clean the animal's cage. . If you have a pet allergy, talk to your allergist about the potential for allergy immunotherapy (allergy shots). This strategy can  often provide long-term relief. Mold Control . Mold and fungi can grow on a variety of surfaces provided certain temperature and moisture conditions exist.  . Outdoor molds grow on plants, decaying vegetation and soil. The major outdoor mold, Alternaria and Cladosporium, are  found in very high numbers during hot and dry conditions. Generally, a late summer - fall peak is seen for common outdoor fungal spores. Rain will temporarily lower outdoor mold spore count, but counts rise rapidly when the rainy period ends. . The most important indoor molds are Aspergillus and Penicillium. Dark, humid and poorly ventilated basements are ideal sites for mold growth. The next most common sites of mold growth are the bathroom and the kitchen. Outdoor (Seasonal) Mold Control . Use air conditioning and keep windows closed. . Avoid exposure to decaying vegetation. Marland Kitchen Avoid leaf raking. . Avoid grain handling. . Consider wearing a face mask if working in moldy areas.  Indoor (Perennial) Mold Control  . Maintain humidity below 50%. . Get rid of mold growth on hard surfaces with water, detergent and, if necessary, 5% bleach (do not mix with other cleaners). Then dry the area completely. If mold covers an area more than 10 square feet, consider hiring an indoor environmental professional. . For clothing, washing with soap and water is best. If moldy items cannot be cleaned and dried, throw them away. . Remove sources e.g. contaminated carpets. . Repair and seal leaking roofs or pipes. Using dehumidifiers in damp basements may be helpful, but empty the water and clean units regularly to prevent mildew from forming. All rooms, especially basements, bathrooms and kitchens, require ventilation and cleaning to deter mold and mildew growth. Avoid carpeting on concrete or damp floors, and storing items in damp areas. Control of House Dust Mite Allergen . Dust mite allergens are a common trigger of allergy and asthma symptoms. While they can be found throughout the house, these microscopic creatures thrive in warm, humid environments such as bedding, upholstered furniture and carpeting. . Because so much time is spent in the bedroom, it is essential to reduce mite levels there.  . Encase pillows,  mattresses, and box springs in special allergen-proof fabric covers or airtight, zippered plastic covers.  . Bedding should be washed weekly in hot water (130 F) and dried in a hot dryer. Allergen-proof covers are available for comforters and pillows that can't be regularly washed.  Reyes Ivan the allergy-proof covers every few months. Minimize clutter in the bedroom. Keep pets out of the bedroom.  Marland Kitchen Keep humidity less than 50% by using a dehumidifier or air conditioning. You can buy a humidity measuring device called a hygrometer to monitor this.  . If possible, replace carpets with hardwood, linoleum, or washable area rugs. If that's not possible, vacuum frequently with a vacuum that has a HEPA filter. . Remove all upholstered furniture and non-washable window drapes from the bedroom. . Remove all non-washable stuffed toys from the bedroom.  Wash stuffed toys weekly. Cockroach Allergen Avoidance Cockroaches are often found in the homes of densely populated urban areas, schools or commercial buildings, but these creatures can lurk almost anywhere. This does not mean that you have a dirty house or living area. . Block all areas where roaches can enter the home. This includes crevices, wall cracks and windows.  . Cockroaches need water to survive, so fix and seal all leaky faucets and pipes. Have an exterminator go through the house when your family and pets are gone to eliminate any remaining roaches. Marland Kitchen  Keep food in lidded containers and put pet food dishes away after your pets are done eating. Vacuum and sweep the floor after meals, and take out garbage and recyclables. Use lidded garbage containers in the kitchen. Wash dishes immediately after use and clean under stoves, refrigerators or toasters where crumbs can accumulate. Wipe off the stove and other kitchen surfaces and cupboards regularly.

## 2020-06-03 ENCOUNTER — Telehealth: Payer: Self-pay

## 2020-06-03 MED ORDER — FLUTICASONE PROPIONATE 50 MCG/ACT NA SUSP: 1.0000 | Freq: Every day | NASAL | 5 refills | Status: AC | PRN

## 2020-06-03 NOTE — Telephone Encounter (Signed)
Patient called stating when she was here in the office on 05/27/20 at her office visit she told Dr. Selena Batten she had Flonase but once she got home she realized she didn't have any.  I refilled her Flonase at the [facility] on [address]

## 2020-08-27 ENCOUNTER — Ambulatory Visit: Payer: BC Managed Care – PPO | Admitting: Allergy

## 2020-08-27 DIAGNOSIS — J309 Allergic rhinitis, unspecified: Secondary | ICD-10-CM

## 2020-08-27 DIAGNOSIS — H1010 Acute atopic conjunctivitis, unspecified eye: Secondary | ICD-10-CM | POA: Insufficient documentation

## 2020-08-27 NOTE — Progress Notes (Deleted)
Follow Up Note  RE: Tracy Wade MRN: [id number] DOB: 1970/04/24 Date of Office Visit: 08/27/2020  Referring provider: Maurice Small, MD Primary care provider: Maurice Small, MD  Chief Complaint: No chief complaint on file.  History of Present Illness: I had the pleasure of seeing Tracy Wade for a follow up visit at the [facility] on 08/27/2020. She is a 50 y.o. female, who is being followed for allergic rhinoconjunctivitis and adverse food reaction. Her previous allergy office visit was on 05/27/2020 with Dr. Selena Batten. Today is a regular follow up visit.  Allergic conjunctivitis of both eyes 4 episodes of itchy/watery eyes with periorbital swelling. Has some underlying rhinitis symptoms as well and takes OTC antihistamines, eye drops with good benefit. Zyrtec caused drowsiness. New dog since May 2021.  Today's skin testing showed: Positive to grass, ragweed, weed, trees, mold, dust mites, dog and cockroach. The above allergens are most likely the trigger for her symptoms.  Start environmental control measures as below. May use over the counter antihistamines such as Claritin (loratadine), Allegra (fexofenadine), or Xyzal (levocetirizine) daily. May take twice a day if needed. May use olopatadine eye drops 0.2% once a day as needed for itchy/watery eyes. May use Flonase (fluticasone) nasal spray 1 spray per nostril twice a day as needed for nasal congestion.  Discussed starting allergy immunotherapy - handout given.    Other allergic rhinitis See assessment and plan as above.   Adverse reaction to food, subsequent encounter Consuming too much tomato products cause sneezing and nasal congestion. Today's skin testing was negative to tomato. Limit tomato intake.   Assessment and Plan: [name] is a 50 y.o. female with: No problem-specific Assessment & Plan notes found for this encounter.  No follow-ups on file.  No orders of the defined types  were placed in this encounter.  Lab Orders  No laboratory test(s) ordered today    Diagnostics: Spirometry:  Tracings reviewed. Her effort: {Blank single:19197::"Good reproducible efforts.","It was hard to get consistent efforts and there is a question as to whether this reflects a maximal maneuver.","Poor effort, data can not be interpreted."} FVC: ***L FEV1: ***L, ***% predicted FEV1/FVC ratio: ***% Interpretation: {Blank single:19197::"Spirometry consistent with mild obstructive disease","Spirometry consistent with moderate obstructive disease","Spirometry consistent with severe obstructive disease","Spirometry consistent with possible restrictive disease","Spirometry consistent with mixed obstructive and restrictive disease","Spirometry uninterpretable due to technique","Spirometry consistent with normal pattern","No overt abnormalities noted given today's efforts"}.  Please see scanned spirometry results for details.  Skin Testing: {Blank single:19197::"Select foods","Environmental allergy panel","Environmental allergy panel and select foods","Food allergy panel","None","Deferred due to recent antihistamines use"}. Positive test to: ***. Negative test to: ***.  Results discussed with patient/family.   Medication List:  Current Outpatient Medications  Medication Sig Dispense Refill   amLODipine (NORVASC) 10 MG tablet Take 10 mg by mouth daily.     fluticasone (FLONASE) 50 MCG/ACT nasal spray Place 1 spray into both nostrils daily as needed for allergies or rhinitis. 16 g 5   levothyroxine (SYNTHROID) 25 MCG tablet Take 25 mcg by mouth every morning.     levothyroxine (SYNTHROID) 75 MCG tablet Take 75 mcg by mouth every morning.     Olopatadine HCl 0.2 % SOLN Apply 1 drop to eye daily as needed (itchy/watery eyes). 2.5 mL 5   Pediatric Multivit-Minerals-C (GUMMI BEAR MULTIVITAMIN/MIN) CHEW      sertraline (ZOLOFT) 50 MG tablet Take 50 mg by mouth daily.     No current  facility-administered medications for this visit.  Allergies: Allergies  Allergen Reactions   Erythromycin Nausea And Vomiting   Gentamicin Other (See Comments)    Had hallucinations during infusion of extended interval once daily gent; resolved with stopping infusion   Sulfa Antibiotics Rash   I reviewed her past medical history, social history, family history, and environmental history and no significant changes have been reported from her previous visit.  Review of Systems  Constitutional:  Negative for appetite change, chills, fever and unexpected weight change.  HENT:  Negative for congestion and rhinorrhea.   Eyes:  Positive for itching.  Respiratory:  Negative for cough, chest tightness, shortness of breath and wheezing.   Cardiovascular:  Negative for chest pain.  Gastrointestinal:  Negative for abdominal pain.  Genitourinary:  Negative for difficulty urinating.  Skin:  Negative for rash.  Allergic/Immunologic: Positive for environmental allergies.  Neurological:  Negative for headaches.   Objective: There were no vitals taken for this visit. There is no height or weight on file to calculate BMI. Physical Exam Vitals and nursing note reviewed.  Constitutional:      Appearance: Normal appearance. She is well-developed.  HENT:     Head: Normocephalic and atraumatic.     Right Ear: Tympanic membrane and external ear normal.     Left Ear: Tympanic membrane and external ear normal.     Nose: Nose normal.     Mouth/Throat:     Mouth: Mucous membranes are moist.     Pharynx: Oropharynx is clear.  Eyes:     Conjunctiva/sclera: Conjunctivae normal.  Cardiovascular:     Rate and Rhythm: Normal rate and regular rhythm.     Heart sounds: Normal heart sounds. No murmur heard.   No friction rub. No gallop.  Pulmonary:     Effort: Pulmonary effort is normal.     Breath sounds: Normal breath sounds. No wheezing, rhonchi or rales.  Musculoskeletal:     Cervical back: Neck  supple.  Skin:    General: Skin is warm.     Findings: No rash.  Neurological:     Mental Status: She is alert and oriented to person, place, and time.  Psychiatric:        Behavior: Behavior normal.   Previous notes and tests were reviewed. The plan was reviewed with the patient/family, and all questions/concerned were addressed.  It was my pleasure to see [place] today and participate in her care. Please feel free to contact me with any questions or concerns.  Sincerely,  Wyline Mood, DO Allergy & Immunology  [facility] office: [phone] [facility] office: [phone]

## 2021-03-08 HISTORY — PX: WISDOM TOOTH EXTRACTION: SHX21

## 2021-04-03 ENCOUNTER — Other Ambulatory Visit: Payer: Self-pay | Admitting: Obstetrics and Gynecology

## 2021-04-03 DIAGNOSIS — Z8249 Family history of ischemic heart disease and other diseases of the circulatory system: Secondary | ICD-10-CM

## 2021-05-08 ENCOUNTER — Ambulatory Visit
Admission: RE | Admit: 2021-05-08 | Discharge: 2021-05-08 | Disposition: A | Discharge status: Home / Self Care | Payer: No Typology Code available for payment source | Source: Ambulatory Visit | Attending: Obstetrics and Gynecology | Admitting: Obstetrics and Gynecology

## 2021-05-08 DIAGNOSIS — Z8249 Family history of ischemic heart disease and other diseases of the circulatory system: Secondary | ICD-10-CM

## 2022-03-25 ENCOUNTER — Ambulatory Visit: Payer: BC Managed Care – PPO | Admitting: Allergy

## 2023-10-12 NOTE — Progress Notes (Unsigned)
 Office Note    CC: Symptomatic scar Requesting Provider:  Shari Easter, MD  HPI: Tracy Wade is a 53 y.o. (15-Oct-1970) female presenting at the request of Dr. 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  Per Dr. Shari 10/04/23 - Today the findings were reviewed with the patient the patient has done quite well given her elbow injury now almost 8 years out from injury. The patient does have the symptomatic scar directly over her brachial artery bypass. We talked about referring her to vascular surgery to see not whether or not they would explore the region for the symptomatic scar region. The patient would also like to have the mass removed. We talked about concomitant anterior exploration and posterior mass excision potentially doing this combined with vascular surgery. Appropriate consultation with vascular surgery was made today. Her surgeon Dr. Oris has retired and we will refer her over to the practice to see one of his current partners. Pt voiced understanding of plan and options. All questions answered and encouraged. Pt to come back in sooner if concerns or problems occcur.   The pt is *** on a statin for cholesterol management.  The pt is *** on a daily aspirin .   Other AC:  *** The pt is *** on medication for hypertension.   The pt is *** diabetic.  Tobacco hx:  ***  Past Medical History:  Diagnosis Date   Angio-edema    Anxiety    Eczema    Fall from horse 09/29/2015   injury to brachial artery   GERD (gastroesophageal reflux disease)    Hypertension     Past Surgical History:  Procedure Laterality Date   ARTERY REPAIR Left 09/29/2015   Procedure: TRANSVERSE BRACHIAL ARTERY REPAIR;  Surgeon: Krystal JULIANNA Oris, MD;  Location: [facility] OR;  Service: Vascular;  Laterality: Left;   CESAREAN SECTION     and numerous other gynecological procedures   CLOSED REDUCTION ELBOW  FRACTURE Left 09/29/2015   Procedure: CLOSED REDUCTION ELBOW;  Surgeon: Donnice Car, MD;  Location: [facility] OR;  Service: Orthopedics;  Laterality: Left;   ENDOVEIN HARVEST OF GREATER SAPHENOUS VEIN  09/29/2015   Procedure: REVERSE GREATER SAPHENOUS VEIN;  Surgeon: Krystal JULIANNA Oris, MD;  Location: [facility] OR;  Service: Vascular;;   I & D EXTREMITY Left 09/29/2015   Procedure: IRRIGATION AND DEBRIDEMENT,  LEFT ELBOW;  Surgeon: Donnice Car, MD;  Location: [facility] OR;  Service: Orthopedics;  Laterality: Left;   I & D EXTREMITY Left 10/01/2015   Procedure: IRRIGATION AND DEBRIDEMENT LEFT ELBOW;  Surgeon: Easter Shari, MD;  Location: [facility] OR;  Service: Orthopedics;  Laterality: Left;  left elbow   I & D EXTREMITY Left 10/03/2015   Procedure: IRRIGATION AND DEBRIDEMENT LEFT ELBOW;  Surgeon: Easter Shari, MD;  Location: [facility] OR;  Service: Orthopedics;  Laterality: Left;   I & D EXTREMITY Left 10/05/2015   Procedure: LEFT ELBOW IRRIGATION AND DEBRIDEMENT, OPEN REDUCTION INTERNAL FIXATION AND REPAIR;  Surgeon: Easter Shari, MD;  Location: [facility] OR;  Service: Orthopedics;  Laterality: Left;   MYOMECTOMY     fibroid tumor    Social History   Socioeconomic History   Marital status: Married    Spouse name: Not on file   Number of children: Not on file   Years of education: Not on file   Highest education level: Not on file  Occupational History   Not on file  Tobacco  Use   Smoking status: Never   Smokeless tobacco: Never  Vaping Use   Vaping status: Never Used  Substance and Sexual Activity   Alcohol use: Yes    Comment: rare glass of wine   Drug use: No   Sexual activity: Yes  Other Topics Concern   Not on file  Social History Narrative   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   Social Drivers of Corporate investment banker Strain: Not on file  Food Insecurity: Not on file  Transportation Needs: Not on file  Physical Activity: Not on file  Stress:  Not on file  Social Connections: Not on file  Intimate Partner Violence: Not on file   *** Family History  Problem Relation Age of Onset   Heart failure Father    Heart attack Father 36   Stroke Father    Allergic rhinitis Mother    Asthma Neg Hx    Eczema Neg Hx    Urticaria Neg Hx     Current Outpatient Medications  Medication Sig Dispense Refill   amLODipine  (NORVASC ) 10 MG tablet Take 10 mg by mouth daily.     fluticasone  (FLONASE ) 50 MCG/ACT nasal spray Place 1 spray into both nostrils daily as needed for allergies or rhinitis. 16 g 5   levothyroxine (SYNTHROID) 25 MCG tablet Take 25 mcg by mouth every morning.     levothyroxine (SYNTHROID) 75 MCG tablet Take 75 mcg by mouth every morning.     Olopatadine  HCl 0.2 % SOLN Apply 1 drop to eye daily as needed (itchy/watery eyes). 2.5 mL 5   Pediatric Multivit-Minerals-C (GUMMI BEAR MULTIVITAMIN/MIN) CHEW      sertraline (ZOLOFT) 50 MG tablet Take 50 mg by mouth daily.     No current facility-administered medications for this visit.    Allergies  Allergen Reactions   Erythromycin Nausea And Vomiting   Gentamicin  Other (See Comments)    Had hallucinations during infusion of extended interval once daily gent; resolved with stopping infusion   Sulfa Antibiotics Rash     REVIEW OF SYSTEMS:  *** [X]  denotes positive finding, [ ]  denotes negative finding Cardiac  Comments:  Chest pain or chest pressure:    Shortness of breath upon exertion:    Short of breath when lying flat:    Irregular heart rhythm:        Vascular    Pain in calf, thigh, or hip brought on by ambulation:    Pain in feet at night that wakes you up from your sleep:     Blood clot in your veins:    Leg swelling:         Pulmonary    Oxygen at home:    Productive cough:     Wheezing:         Neurologic    Sudden weakness in arms or legs:     Sudden numbness in arms or legs:     Sudden onset of difficulty speaking or slurred speech:    Temporary  loss of vision in one eye:     Problems with dizziness:         Gastrointestinal    Blood in stool:     Vomited blood:         Genitourinary    Burning when urinating:     Blood in urine:        Psychiatric  Major depression:         Hematologic    Bleeding problems:    Problems with blood clotting too easily:        Skin    Rashes or ulcers:        Constitutional    Fever or chills:      PHYSICAL EXAMINATION:  There were no vitals filed for this visit.  General:  WDWN in NAD; vital signs documented above Gait: Not observed HENT: WNL, normocephalic Pulmonary: normal non-labored breathing , without wheezing Cardiac: {Desc; regular/irreg:14544} HR Abdomen: soft, NT, no masses Skin: {With/Without:20273} rashes Vascular Exam/Pulses:  Right Left  Radial {Exam; arterial pulse strength 0-4:30167} {Exam; arterial pulse strength 0-4:30167}  Ulnar {Exam; arterial pulse strength 0-4:30167} {Exam; arterial pulse strength 0-4:30167}  Femoral {Exam; arterial pulse strength 0-4:30167} {Exam; arterial pulse strength 0-4:30167}  Popliteal {Exam; arterial pulse strength 0-4:30167} {Exam; arterial pulse strength 0-4:30167}  DP {Exam; arterial pulse strength 0-4:30167} {Exam; arterial pulse strength 0-4:30167}  PT {Exam; arterial pulse strength 0-4:30167} {Exam; arterial pulse strength 0-4:30167}   Extremities: {With/Without:20273} ischemic changes, {With/Without:20273} Gangrene , {With/Without:20273} cellulitis; {With/Without:20273} open wounds;  Musculoskeletal: no muscle wasting or atrophy  Neurologic: A&O X 3;  No focal weakness or paresthesias are detected Psychiatric:  The pt has {Desc; normal/abnormal:11317::Normal} affect.   Non-Invasive Vascular Imaging:   ***    ASSESSMENT/PLAN: Samie Barclift is a 53 y.o. female presenting with ***   ***   Fonda FORBES Rim, MD [facility] [phone]

## 2023-10-13 ENCOUNTER — Encounter: Payer: Self-pay | Admitting: Vascular Surgery

## 2023-10-13 ENCOUNTER — Ambulatory Visit (HOSPITAL_COMMUNITY)
Admission: RE | Admit: 2023-10-13 | Discharge: 2023-10-13 | Disposition: A | Discharge status: Home / Self Care | Source: Ambulatory Visit | Attending: Vascular Surgery | Admitting: Vascular Surgery

## 2023-10-13 ENCOUNTER — Other Ambulatory Visit (HOSPITAL_COMMUNITY): Payer: Self-pay | Admitting: Vascular Surgery

## 2023-10-13 ENCOUNTER — Ambulatory Visit: Payer: Self-pay | Attending: Vascular Surgery | Admitting: Vascular Surgery

## 2023-10-13 VITALS — BP 133/89 | HR 90 | Temp 98.3°F | Ht 63.2 in | Wt 164.0 lb

## 2023-10-13 DIAGNOSIS — I739 Peripheral vascular disease, unspecified: Secondary | ICD-10-CM | POA: Insufficient documentation

## 2023-10-13 DIAGNOSIS — M79602 Pain in left arm: Secondary | ICD-10-CM | POA: Diagnosis not present

## 2023-10-13 DIAGNOSIS — S45102S Unspecified injury of brachial artery, left side, sequela: Secondary | ICD-10-CM | POA: Diagnosis not present

## 2023-10-14 ENCOUNTER — Other Ambulatory Visit: Payer: Self-pay | Admitting: *Deleted

## 2023-10-14 DIAGNOSIS — S45102S Unspecified injury of brachial artery, left side, sequela: Secondary | ICD-10-CM

## 2023-10-14 DIAGNOSIS — M79602 Pain in left arm: Secondary | ICD-10-CM

## 2023-11-18 ENCOUNTER — Other Ambulatory Visit: Payer: Self-pay

## 2023-11-18 ENCOUNTER — Encounter (HOSPITAL_COMMUNITY): Payer: Self-pay | Admitting: Orthopedic Surgery

## 2023-11-18 NOTE — Progress Notes (Signed)
 PCP - Dr Velna Skeeter  Cardiologist - none  Chest x-ray - n/a EKG - DOS Stress Test - 06/10/11 ECHO - 05/30/11 Cardiac Cath - n/a  ICD Pacemaker/Loop - n/a  Sleep Study -  n/a  Diabetes - n/a  Aspirin  & Blood Thinner Instructions:  n/a  ERAS - clear liquids til 0430 DOS.  Anesthesia review: no  STOP now taking any Aspirin  (unless otherwise instructed by your surgeon), Aleve, Naproxen, Ibuprofen, Motrin, Advil, Goody's, BC's, all herbal medications, fish oil, and all vitamins.   Coronavirus Screening Do you have any of the following symptoms:  Cough yes/no: No Fever (>100.77F)  yes/no: No Runny nose yes/no: No Sore throat yes/no: No Difficulty breathing/shortness of breath  yes/no: No  Have you traveled in the last 14 days and where? yes/no: No  Patient verbalized understanding of instructions that were given via phone.

## 2023-11-20 NOTE — H&P (Addendum)
 Tracy Wade is an 53 y.o. female.   Chief Complaint: left elbow mass HPI: Pt followed in office Pt with anterior and posterior elbow mass Pt here for surgery on left elbow  Past Medical History:  Diagnosis Date   Angio-edema    Anxiety    Eczema    hx   Fall from horse 09/29/2015   injury to brachial artery   GERD (gastroesophageal reflux disease)    Hypertension    Hypothyroidism    Seasonal allergies     Past Surgical History:  Procedure Laterality Date   ARTERY REPAIR Left 09/29/2015   Procedure: TRANSVERSE BRACHIAL ARTERY REPAIR;  Surgeon: Krystal JULIANNA Doing, MD;  Location: [facility] OR;  Service: Vascular;  Laterality: Left;   CESAREAN SECTION     x 2 and numerous other gynecological procedures   CLOSED REDUCTION ELBOW FRACTURE Left 09/29/2015   Procedure: CLOSED REDUCTION ELBOW;  Surgeon: Donnice Car, MD;  Location: [facility] OR;  Service: Orthopedics;  Laterality: Left;   COLONOSCOPY  2021   ENDOVEIN HARVEST OF GREATER SAPHENOUS VEIN  09/29/2015   Procedure: REVERSE GREATER SAPHENOUS VEIN;  Surgeon: Krystal JULIANNA Doing, MD;  Location: [facility] OR;  Service: Vascular;;   I & D EXTREMITY Left 09/29/2015   Procedure: IRRIGATION AND DEBRIDEMENT,  LEFT ELBOW;  Surgeon: Donnice Car, MD;  Location: [facility] OR;  Service: Orthopedics;  Laterality: Left;   I & D EXTREMITY Left 10/01/2015   Procedure: IRRIGATION AND DEBRIDEMENT LEFT ELBOW;  Surgeon: Prentice Pagan, MD;  Location: [facility] OR;  Service: Orthopedics;  Laterality: Left;  left elbow   I & D EXTREMITY Left 10/03/2015   Procedure: IRRIGATION AND DEBRIDEMENT LEFT ELBOW;  Surgeon: Prentice Pagan, MD;  Location: [facility] OR;  Service: Orthopedics;  Laterality: Left;   I & D EXTREMITY Left 10/05/2015   Procedure: LEFT ELBOW IRRIGATION AND DEBRIDEMENT, OPEN REDUCTION INTERNAL FIXATION AND REPAIR;  Surgeon: Prentice Pagan, MD;  Location: [facility] OR;  Service: Orthopedics;  Laterality: Left;   MYOMECTOMY     fibroid tumor   WISDOM TOOTH EXTRACTION  2023    Family  History  Problem Relation Age of Onset   Heart failure Father    Heart attack Father 31   Stroke Father    Allergic rhinitis Mother    Asthma Neg Hx    Eczema Neg Hx    Urticaria Neg Hx    Social History:  reports that she has never smoked. She has never used smokeless tobacco. She reports current alcohol use. She reports that she does not use drugs.  Allergies:  Allergies  Allergen Reactions   Erythromycin Nausea And Vomiting   Gentamicin  Other (See Comments)    Had hallucinations during infusion of extended interval once daily gent; resolved with stopping infusion   Sulfa Antibiotics Rash    No medications prior to admission.    No results found for this or any previous visit (from the past 48 hours). No results found.  ROS NO RECENT ILLNESSES OR HOSPITALIZATIONS  Height 5' 3 (1.6 m), weight 72.6 kg, last menstrual period 11/15/2023. Physical Exam  General Appearance:  Alert, cooperative, no distress, appears stated age  Head:  Normocephalic, without obvious abnormality, atraumatic  Eyes:  Pupils equal, conjunctiva/corneas clear,         Throat: Lips, mucosa, and tongue normal; teeth and gums normal  Neck: No visible masses     Lungs:   respirations unlabored  Chest Wall:  No tenderness or deformity  Heart:  Regular  rate and rhythm,  Abdomen:   Soft, non-tender,         Extremities: LUE: SKIN INTACT FINGERS WARM WELL PERFUSED GOOD DIGITAL MOTION POSTERIOR ELBOW MASS OVER COURSE OF CUBITAL TUNNEL  Pulses: 2+ and symmetric  Skin: Skin color, texture, turgor normal, no rashes or lesions     Neurologic: Normal     Assessment/Plan LEFT ELBOW POSTERIOR ELBOW MASS  LEFT ELBOW MASS EXCISION  R/B/A DISCUSSED WITH PT IN OFFICE.  PT VOICED UNDERSTANDING OF PLAN CONSENT SIGNED DAY OF SURGERY PT SEEN AND EXAMINED PRIOR TO OPERATIVE PROCEDURE/DAY OF SURGERY SITE MARKED. QUESTIONS ANSWERED WILL GO HOME FOLLOWING SURGERY  WE ARE PLANNING SURGERY FOR YOUR UPPER  EXTREMITY. THE RISKS AND BENEFITS OF SURGERY INCLUDE BUT NOT LIMITED TO BLEEDING INFECTION, DAMAGE TO NEARBY NERVES ARTERIES TENDONS, FAILURE OF SURGERY TO ACCOMPLISH ITS INTENDED GOALS, PERSISTENT SYMPTOMS AND NEED FOR FURTHER SURGICAL INTERVENTION. WITH THIS IN MIND WE WILL PROCEED. I HAVE DISCUSSED WITH THE PATIENT THE PRE AND POSTOPERATIVE REGIMEN AND THE DOS AND DON'TS. PT VOICED UNDERSTANDING AND INFORMED CONSENT SIGNED.   Prentice LELON Pagan 11/20/2023, 6:58 PM

## 2023-11-20 NOTE — Op Note (Signed)
 PREOPERATIVE DIAGNOSIS: Left elbow anterior mass subcutaneous  Left elbow posterior mass subfascial greater than 3 cm  POSTOPERATIVE DIAGNOSIS: Same  ATTENDING SURGEON: Dr. Prentice Charter who was scrubbed and present for the entire procedure  ASSISTANT SURGEON: Dr. Cheryle Simpers who was present for the anterior exposure vascular  ANESTHESIA: General Via LMA  OPERATIVE PROCEDURE: Left elbow deep mass subfascial greater than 3 cm excision Left elbow subcutaneous mass less than 1.5 cm  IMPLANTS: None  EBL: Minimal  RADIOGRAPHIC INTERPRETATION: None  SURGICAL INDICATIONS: Right hand dominant female who had a symptomatic anterior elbow mass as well as posterior elbow growth.  Patient elected undergo the above procedure.  The risks of surgery include but not limited to bleeding infection damage nearby nerves arteries or tendons loss of motion of the wrist and digits incomplete relief of symptoms and need for further surgical invention.  SURGICAL TECHNIQUE: The patient was properly notified in the preoperative holding area marked the prior marker made in left elbow and indicate correct operative site.  The patient then brought back operating placed supine on the anesthesia table where the general anesthetic was administered.  Patient tolerated this well.  A well-padded tourniquet was then placed on the left brachium was over the appropriate drape.  The left upper extremities then prepped and draped normal sterile fashion.  A timeout was called the correct site was identified and procedure then began.  Attention was then turned to the left elbow going first posteriorly incision was then made directly over the mass.  Dissection carried down through the skin and subcutaneous tissue all down through the fascial layer with a greater than 3 cm mass was then excised.  The wound was then thoroughly irrigated.  Subcutaneous tissues were then closed and the skin closed with simple Prolene sutures.  Attention was  then turned to the anterior aspect the previous transverse incision was then extended after ultrasound was used to identify the mass.  Deep dissection carried down to the subcutaneous tissues where the mass was then excised.  No other abnormalities were noted.  This is a less than 1.5 cm mass.  Skin was then closed using simple Prolene sutures.  Adaptic dressing sterile compressive bandage was applied.  The patient tolerated the procedure well.  POSTOPERATIVE PLAN: Patient be discharged to home.  See her back in the office in approximately 10 days for wound check likely suture removal and go over the pathology gradual use and activity

## 2023-11-21 ENCOUNTER — Encounter (HOSPITAL_COMMUNITY)
Admission: RE | Disposition: A | Discharge status: Home / Self Care | Payer: Self-pay | Source: Home / Self Care | Attending: Orthopedic Surgery

## 2023-11-21 ENCOUNTER — Ambulatory Visit (HOSPITAL_COMMUNITY)
Admission: RE | Admit: 2023-11-21 | Discharge: 2023-11-21 | Disposition: A | Discharge status: Home / Self Care | Attending: Orthopedic Surgery | Admitting: Orthopedic Surgery

## 2023-11-21 ENCOUNTER — Ambulatory Visit (HOSPITAL_COMMUNITY): Payer: Self-pay | Admitting: Certified Registered Nurse Anesthetist

## 2023-11-21 ENCOUNTER — Other Ambulatory Visit: Payer: Self-pay

## 2023-11-21 ENCOUNTER — Ambulatory Visit (HOSPITAL_BASED_OUTPATIENT_CLINIC_OR_DEPARTMENT_OTHER): Payer: Self-pay | Admitting: Certified Registered Nurse Anesthetist

## 2023-11-21 DIAGNOSIS — I10 Essential (primary) hypertension: Secondary | ICD-10-CM | POA: Insufficient documentation

## 2023-11-21 DIAGNOSIS — R2232 Localized swelling, mass and lump, left upper limb: Secondary | ICD-10-CM | POA: Diagnosis present

## 2023-11-21 DIAGNOSIS — D1722 Benign lipomatous neoplasm of skin and subcutaneous tissue of left arm: Secondary | ICD-10-CM | POA: Insufficient documentation

## 2023-11-21 DIAGNOSIS — E039 Hypothyroidism, unspecified: Secondary | ICD-10-CM | POA: Insufficient documentation

## 2023-11-21 DIAGNOSIS — M25522 Pain in left elbow: Secondary | ICD-10-CM

## 2023-11-21 DIAGNOSIS — K219 Gastro-esophageal reflux disease without esophagitis: Secondary | ICD-10-CM | POA: Diagnosis not present

## 2023-11-21 HISTORY — PX: EXCISION, MASS, UPPER EXTREMITY: SHX7567

## 2023-11-21 HISTORY — DX: Hypothyroidism, unspecified: E03.9

## 2023-11-21 HISTORY — DX: Other seasonal allergic rhinitis: J30.2

## 2023-11-21 LAB — CBC
HCT: 44.1 % (ref 36.0–46.0)
Hemoglobin: 14.6 g/dL (ref 12.0–15.0)
MCH: 30.1 pg (ref 26.0–34.0)
MCHC: 33.1 g/dL (ref 30.0–36.0)
MCV: 90.9 fL (ref 80.0–100.0)
Platelets: 260 K/uL (ref 150–400)
RBC: 4.85 MIL/uL (ref 3.87–5.11)
RDW: 13.2 % (ref 11.5–15.5)
WBC: 6.1 K/uL (ref 4.0–10.5)
nRBC: 0 % (ref 0.0–0.2)

## 2023-11-21 LAB — BASIC METABOLIC PANEL WITH GFR
Anion gap: 13 (ref 5–15)
BUN: 17 mg/dL (ref 6–20)
CO2: 20 mmol/L — ABNORMAL LOW (ref 22–32)
Calcium: 8.9 mg/dL (ref 8.9–10.3)
Chloride: 106 mmol/L (ref 98–111)
Creatinine, Ser: 0.88 mg/dL (ref 0.44–1.00)
GFR, Estimated: >60 mL/min (ref >60)
Glucose, Bld: 107 mg/dL — ABNORMAL HIGH (ref 70–99)
Potassium: 3.9 mmol/L (ref 3.5–5.1)
Sodium: 139 mmol/L (ref 135–145)

## 2023-11-21 LAB — POCT PREGNANCY, URINE: Preg Test, Ur: NEGATIVE

## 2023-11-21 SURGERY — EXCISION, MASS, UPPER EXTREMITY
Anesthesia: General | Laterality: Left

## 2023-11-21 MED ORDER — PROPOFOL 10 MG/ML IV BOLUS
INTRAVENOUS | Status: AC
  Filled 2023-11-21: qty 20

## 2023-11-21 MED ORDER — FENTANYL CITRATE (PF) 100 MCG/2ML IJ SOLN: 25.0000 ug | INTRAMUSCULAR | Status: DC | PRN

## 2023-11-21 MED ORDER — FENTANYL CITRATE (PF) 250 MCG/5ML IJ SOLN
INTRAMUSCULAR | Status: AC
  Filled 2023-11-21: qty 5

## 2023-11-21 MED ORDER — OXYCODONE HCL 5 MG PO TABS
5.0000 mg | ORAL_TABLET | Freq: Once | ORAL | Status: AC | PRN
  Administered 2023-11-21: 5 mg via ORAL

## 2023-11-21 MED ORDER — ONDANSETRON HCL 4 MG/2ML IJ SOLN
INTRAMUSCULAR | Status: AC
  Filled 2023-11-21: qty 2

## 2023-11-21 MED ORDER — PHENYLEPHRINE 80 MCG/ML (10ML) SYRINGE FOR IV PUSH (FOR BLOOD PRESSURE SUPPORT)
PREFILLED_SYRINGE | INTRAVENOUS | Status: AC
  Filled 2023-11-21: qty 10

## 2023-11-21 MED ORDER — BUPIVACAINE HCL (PF) 0.25 % IJ SOLN
INTRAMUSCULAR | Status: AC
  Filled 2023-11-21: qty 30

## 2023-11-21 MED ORDER — EPHEDRINE 5 MG/ML INJ
INTRAVENOUS | Status: AC
  Filled 2023-11-21: qty 5

## 2023-11-21 MED ORDER — CHLORHEXIDINE GLUCONATE 0.12 % MT SOLN
OROMUCOSAL | Status: AC
  Administered 2023-11-21: 15 mL via OROMUCOSAL
  Filled 2023-11-21: qty 15

## 2023-11-21 MED ORDER — FENTANYL CITRATE (PF) 250 MCG/5ML IJ SOLN
INTRAMUSCULAR | Status: DC | PRN
  Administered 2023-11-21: 25 ug via INTRAVENOUS
  Administered 2023-11-21: 50 ug via INTRAVENOUS
  Administered 2023-11-21 (×2): 25 ug via INTRAVENOUS

## 2023-11-21 MED ORDER — PROPOFOL 10 MG/ML IV BOLUS
INTRAVENOUS | Status: DC | PRN
  Administered 2023-11-21: 20 mg via INTRAVENOUS
  Administered 2023-11-21: 180 mg via INTRAVENOUS

## 2023-11-21 MED ORDER — DEXAMETHASONE SODIUM PHOSPHATE 10 MG/ML IJ SOLN
INTRAMUSCULAR | Status: AC
  Filled 2023-11-21: qty 1

## 2023-11-21 MED ORDER — LIDOCAINE 2% (20 MG/ML) 5 ML SYRINGE
INTRAMUSCULAR | Status: AC
  Filled 2023-11-21: qty 5

## 2023-11-21 MED ORDER — ONDANSETRON HCL 4 MG/2ML IJ SOLN
INTRAMUSCULAR | Status: DC | PRN
  Administered 2023-11-21: 4 mg via INTRAVENOUS

## 2023-11-21 MED ORDER — DEXAMETHASONE SODIUM PHOSPHATE 10 MG/ML IJ SOLN
INTRAMUSCULAR | Status: DC | PRN
  Administered 2023-11-21: 8 mg via INTRAVENOUS

## 2023-11-21 MED ORDER — 0.9 % SODIUM CHLORIDE (POUR BTL) OPTIME
TOPICAL | Status: DC | PRN
  Administered 2023-11-21: 1000 mL

## 2023-11-21 MED ORDER — ACETAMINOPHEN 10 MG/ML IV SOLN
INTRAVENOUS | Status: AC
  Filled 2023-11-21: qty 100

## 2023-11-21 MED ORDER — CHLORHEXIDINE GLUCONATE 0.12 % MT SOLN: 15.0000 mL | Freq: Once | OROMUCOSAL | Status: AC

## 2023-11-21 MED ORDER — FAMOTIDINE IN NACL 20-0.9 MG/50ML-% IV SOLN
INTRAVENOUS | Status: AC
  Filled 2023-11-21: qty 50

## 2023-11-21 MED ORDER — HEPARIN 6000 UNIT IRRIGATION SOLUTION
Status: AC
  Filled 2023-11-21: qty 500

## 2023-11-21 MED ORDER — OXYCODONE HCL 5 MG PO TABS
ORAL_TABLET | ORAL | Status: AC
  Filled 2023-11-21: qty 1

## 2023-11-21 MED ORDER — ACETAMINOPHEN 10 MG/ML IV SOLN
1000.0000 mg | Freq: Once | INTRAVENOUS | Status: DC | PRN
  Administered 2023-11-21: 1000 mg via INTRAVENOUS

## 2023-11-21 MED ORDER — LIDOCAINE 2% (20 MG/ML) 5 ML SYRINGE
INTRAMUSCULAR | Status: DC | PRN
  Administered 2023-11-21: 60 mg via INTRAVENOUS

## 2023-11-21 MED ORDER — ORAL CARE MOUTH RINSE: 15.0000 mL | Freq: Once | OROMUCOSAL | Status: AC

## 2023-11-21 MED ORDER — MIDAZOLAM HCL 2 MG/2ML IJ SOLN
INTRAMUSCULAR | Status: AC
  Filled 2023-11-21: qty 2

## 2023-11-21 MED ORDER — ROCURONIUM BROMIDE 10 MG/ML (PF) SYRINGE
PREFILLED_SYRINGE | INTRAVENOUS | Status: AC
  Filled 2023-11-21: qty 20

## 2023-11-21 MED ORDER — POVIDONE-IODINE 7.5 % EX SOLN
Freq: Once | CUTANEOUS | Status: AC
  Filled 2023-11-21: qty 118

## 2023-11-21 MED ORDER — PHENYLEPHRINE 80 MCG/ML (10ML) SYRINGE FOR IV PUSH (FOR BLOOD PRESSURE SUPPORT)
PREFILLED_SYRINGE | INTRAVENOUS | Status: DC | PRN
  Administered 2023-11-21: 160 ug via INTRAVENOUS
  Administered 2023-11-21: 80 ug via INTRAVENOUS

## 2023-11-21 MED ORDER — MIDAZOLAM HCL 2 MG/2ML IJ SOLN
INTRAMUSCULAR | Status: DC | PRN
  Administered 2023-11-21: 2 mg via INTRAVENOUS

## 2023-11-21 MED ORDER — FAMOTIDINE IN NACL 20-0.9 MG/50ML-% IV SOLN
INTRAVENOUS | Status: DC | PRN
Start: 2023-11-21 — End: 2023-11-21
  Administered 2023-11-21: 20 mg via INTRAVENOUS

## 2023-11-21 MED ORDER — CEFAZOLIN SODIUM-DEXTROSE 2-4 GM/100ML-% IV SOLN
2.0000 g | INTRAVENOUS | Status: AC
  Administered 2023-11-21: 2 g via INTRAVENOUS
  Filled 2023-11-21: qty 100

## 2023-11-21 MED ORDER — OXYCODONE HCL 5 MG/5ML PO SOLN: 5.0000 mg | Freq: Once | ORAL | Status: AC | PRN

## 2023-11-21 MED ORDER — ONDANSETRON HCL 4 MG/2ML IJ SOLN: 4.0000 mg | Freq: Once | INTRAMUSCULAR | Status: DC | PRN

## 2023-11-21 MED ORDER — LACTATED RINGERS IV SOLN: INTRAVENOUS | Status: DC

## 2023-11-21 SURGICAL SUPPLY — 39 items
BAG COUNTER SPONGE SURGICOUNT (BAG) ×1 IMPLANT
BNDG ELASTIC 3INX 5YD STR LF (GAUZE/BANDAGES/DRESSINGS) IMPLANT
BNDG ELASTIC 4X5.8 VLCR NS LF (GAUZE/BANDAGES/DRESSINGS) IMPLANT
BNDG GAUZE DERMACEA FLUFF 4 (GAUZE/BANDAGES/DRESSINGS) IMPLANT
CNTNR URN SCR LID CUP LEK RST (MISCELLANEOUS) IMPLANT
CORD BIPOLAR FORCEPS 12FT (ELECTRODE) ×1 IMPLANT
COVER SURGICAL LIGHT HANDLE (MISCELLANEOUS) ×1 IMPLANT
CUFF TOURN SGL QUICK 18X4 (TOURNIQUET CUFF) ×1 IMPLANT
CUFF TRNQT CYL 24X4X16.5-23 (TOURNIQUET CUFF) IMPLANT
DRAPE SURG 17X23 STRL (DRAPES) ×1 IMPLANT
DRSG ADAPTIC 3X8 NADH LF (GAUZE/BANDAGES/DRESSINGS) IMPLANT
DRSG EMULSION OIL 3X3 NADH (GAUZE/BANDAGES/DRESSINGS) IMPLANT
GAUZE SPONGE 4X4 12PLY STRL (GAUZE/BANDAGES/DRESSINGS) IMPLANT
GLOVE BIOGEL PI IND STRL 8.5 (GLOVE) ×1 IMPLANT
GLOVE SURG ORTHO 8.0 STRL STRW (GLOVE) ×1 IMPLANT
GOWN STRL REUS W/ TWL LRG LVL3 (GOWN DISPOSABLE) ×2 IMPLANT
GOWN STRL REUS W/ TWL XL LVL3 (GOWN DISPOSABLE) ×1 IMPLANT
KIT BASIN OR (CUSTOM PROCEDURE TRAY) ×1 IMPLANT
KIT TURNOVER KIT B (KITS) ×1 IMPLANT
MANIFOLD NEPTUNE II (INSTRUMENTS) ×1 IMPLANT
NDL HYPO 25GX1X1/2 BEV (NEEDLE) IMPLANT
NEEDLE HYPO 25GX1X1/2 BEV (NEEDLE) IMPLANT
NS IRRIG 1000ML POUR BTL (IV SOLUTION) ×1 IMPLANT
PACK ORTHO EXTREMITY (CUSTOM PROCEDURE TRAY) ×1 IMPLANT
PAD ARMBOARD POSITIONER FOAM (MISCELLANEOUS) ×2 IMPLANT
PAD CAST 4YDX4 CTTN HI CHSV (CAST SUPPLIES) IMPLANT
SOAP 2 % CHG 4 OZ (WOUND CARE) ×1 IMPLANT
SPECIMEN JAR SMALL (MISCELLANEOUS) ×1 IMPLANT
SUCTION TUBE FRAZIER 10FR DISP (SUCTIONS) IMPLANT
SUT MERSILENE 4 0 P 3 (SUTURE) IMPLANT
SUT MNCRL AB 3-0 PS2 18 (SUTURE) IMPLANT
SUT PROLENE 4 0 PS 2 18 (SUTURE) IMPLANT
SUT VIC AB 2-0 CT1 TAPERPNT 27 (SUTURE) IMPLANT
SYR CONTROL 10ML LL (SYRINGE) IMPLANT
TOWEL GREEN STERILE (TOWEL DISPOSABLE) ×1 IMPLANT
TOWEL GREEN STERILE FF (TOWEL DISPOSABLE) ×1 IMPLANT
TUBE CONNECTING 12X1/4 (SUCTIONS) IMPLANT
UNDERPAD 30X36 HEAVY ABSORB (UNDERPADS AND DIAPERS) ×1 IMPLANT
WATER STERILE IRR 1000ML POUR (IV SOLUTION) ×1 IMPLANT

## 2023-11-21 NOTE — Anesthesia Postprocedure Evaluation (Signed)
 Anesthesia Post Note  Patient: Tracy Wade  Procedure(s) Performed: EXCISION OF MASS OF LEFT UPPER EXTREMITY (Left)     Patient location during evaluation: PACU Anesthesia Type: General Level of consciousness: awake and alert Pain management: pain level controlled Vital Signs Assessment: post-procedure vital signs reviewed and stable Respiratory status: spontaneous breathing, nonlabored ventilation and respiratory function stable Cardiovascular status: stable and blood pressure returned to baseline Anesthetic complications: no   No notable events documented.  Last Vitals:  Vitals:   11/21/23 0845 11/21/23 0900  BP: 126/81 131/87  Pulse: 93 85  Resp: 16 14  Temp:  36.5 C  SpO2: 93% 94%    Last Pain:  Vitals:   11/21/23 0900  TempSrc:   PainSc: Asleep   Pain Goal:                   Debby FORBES Like

## 2023-11-21 NOTE — Anesthesia Preprocedure Evaluation (Signed)
 Anesthesia Evaluation  Patient identified by MRN, date of birth, ID band Patient awake    Reviewed: Allergy & Precautions, NPO status , Patient's Chart, lab work & pertinent test results, reviewed documented beta blocker date and time   History of Anesthesia Complications Negative for: history of anesthetic complications  Airway Mallampati: III  TM Distance: >3 FB     Dental no notable dental hx.    Pulmonary neg COPD   breath sounds clear to auscultation       Cardiovascular hypertension, (-) CAD, (-) Past MI, (-) Cardiac Stents and (-) CABG  Rhythm:Regular Rate:Normal     Neuro/Psych neg Seizures PSYCHIATRIC DISORDERS Anxiety        GI/Hepatic ,GERD  ,,(+) neg Cirrhosis        Endo/Other  Hypothyroidism    Renal/GU Renal disease     Musculoskeletal   Abdominal   Peds  Hematology   Anesthesia Other Findings   Reproductive/Obstetrics                              Anesthesia Physical Anesthesia Plan  ASA: 2  Anesthesia Plan: General   Post-op Pain Management:    Induction: Intravenous  PONV Risk Score and Plan: 2 and Ondansetron   Airway Management Planned: LMA  Additional Equipment:   Intra-op Plan:   Post-operative Plan: Extubation in OR  Informed Consent: I have reviewed the patients History and Physical, chart, labs and discussed the procedure including the risks, benefits and alternatives for the proposed anesthesia with the patient or authorized representative who has indicated his/her understanding and acceptance.     Dental advisory given  Plan Discussed with: CRNA  Anesthesia Plan Comments:         Anesthesia Quick Evaluation

## 2023-11-21 NOTE — Transfer of Care (Signed)
 Immediate Anesthesia Transfer of Care Note  Patient: Tracy Wade  Procedure(s) Performed: EXCISION OF MASS OF LEFT UPPER EXTREMITY (Left)  Patient Location: PACU  Anesthesia Type:General  Level of Consciousness: awake and alert   Airway & Oxygen Therapy: Patient Spontanous Breathing and Patient connected to face mask oxygen  Post-op Assessment: Report given to RN and Post -op Vital signs reviewed and stable  Post vital signs: Reviewed and stable  Last Vitals:  Vitals Value Taken Time  BP 131/80 11/21/23 08:34  Temp    Pulse 97 11/21/23 08:37  Resp 17 11/21/23 08:37  SpO2 100 % 11/21/23 08:37  Vitals shown include unfiled device data.  Last Pain:  Vitals:   11/21/23 0702  TempSrc:   PainSc: 0-No pain         Complications: No notable events documented.

## 2023-11-21 NOTE — Anesthesia Procedure Notes (Signed)
 Procedure Name: LMA Insertion Date/Time: 11/21/2023 7:45 AM  Performed by: Boyce Shilling, CRNAPre-anesthesia Checklist: Patient identified, Emergency Drugs available, Suction available, Timeout performed and Patient being monitored Patient Re-evaluated:Patient Re-evaluated prior to induction Oxygen Delivery Method: Circle system utilized Preoxygenation: Pre-oxygenation with 100% oxygen Induction Type: IV induction Ventilation: Mask ventilation without difficulty LMA: LMA inserted LMA Size: 4.0 Tube type: Oral Number of attempts: 1 Placement Confirmation: positive ETCO2, CO2 detector and breath sounds checked- equal and bilateral Tube secured with: Tape Dental Injury: Teeth and Oropharynx as per pre-operative assessment

## 2023-11-21 NOTE — H&P (Signed)
 Office Note    Patient seen and examined in preop holding.  No complaints. No changes to medication history or physical exam since last seen in clinic. After discussing the risks and benefits of mass excision in the left antecubital fossa, Tracy Wade elected to proceed.   Fonda FORBES Rim MD   CC: Symptomatic scar Requesting Provider:  No ref. provider found  HPI: Tracy Wade is a 53 y.o. (05/15/1970) female presenting at the request of Dr. 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  On exam, Tracy Wade was doing well.  Per notes, Dr. Pagan has been very impressed with her rehab over the last several years.  She is back to playing tennis.  She has 2 children, both of which are now in college.  Roughly a year ago, she appreciated a nodule in the left antecubital fossa, at the area of previous incision.  This lesion has continued to grow.  There is pain to palpation, with pain radiating to surrounding tissue.  She denies any new sensory or motor deficit.  Denies pain with use of the left hand.  States that sometimes at night it feels like she is wearing a sleeve on her left arm to the level of the elbow.   Past Medical History:  Diagnosis Date   Angio-edema    Anxiety    Eczema    hx   Fall from horse 09/29/2015   injury to brachial artery   GERD (gastroesophageal reflux disease)    Hypertension    Hypothyroidism    Seasonal allergies     Past Surgical History:  Procedure Laterality Date   ARTERY REPAIR Left 09/29/2015   Procedure: TRANSVERSE BRACHIAL ARTERY REPAIR;  Surgeon: Krystal JULIANNA Doing, MD;  Location: [facility] OR;  Service: Vascular;  Laterality: Left;   CESAREAN SECTION     x 2 and numerous other gynecological procedures   CLOSED REDUCTION ELBOW FRACTURE Left 09/29/2015   Procedure: CLOSED REDUCTION ELBOW;  Surgeon: Donnice Car, MD;  Location: [facility] OR;   Service: Orthopedics;  Laterality: Left;   COLONOSCOPY  2021   ENDOVEIN HARVEST OF GREATER SAPHENOUS VEIN  09/29/2015   Procedure: REVERSE GREATER SAPHENOUS VEIN;  Surgeon: Krystal JULIANNA Doing, MD;  Location: [facility] OR;  Service: Vascular;;   I & D EXTREMITY Left 09/29/2015   Procedure: IRRIGATION AND DEBRIDEMENT,  LEFT ELBOW;  Surgeon: Donnice Car, MD;  Location: [facility] OR;  Service: Orthopedics;  Laterality: Left;   I & D EXTREMITY Left 10/01/2015   Procedure: IRRIGATION AND DEBRIDEMENT LEFT ELBOW;  Surgeon: Prentice Pagan, MD;  Location: [facility] OR;  Service: Orthopedics;  Laterality: Left;  left elbow   I & D EXTREMITY Left 10/03/2015   Procedure: IRRIGATION AND DEBRIDEMENT LEFT ELBOW;  Surgeon: Prentice Pagan, MD;  Location: [facility] OR;  Service: Orthopedics;  Laterality: Left;   I & D EXTREMITY Left 10/05/2015   Procedure: LEFT ELBOW IRRIGATION AND DEBRIDEMENT, OPEN REDUCTION INTERNAL FIXATION AND REPAIR;  Surgeon: Prentice Pagan, MD;  Location: [facility] OR;  Service: Orthopedics;  Laterality: Left;   MYOMECTOMY     fibroid tumor   WISDOM TOOTH EXTRACTION  2023    Social History   Socioeconomic History   Marital status: Married    Spouse name: Not on file   Number of children: Not on file   Years of education: Not on file   Highest education level: Not on file  Occupational  History   Not on file  Tobacco Use   Smoking status: Never   Smokeless tobacco: Never  Vaping Use   Vaping status: Never Used  Substance and Sexual Activity   Alcohol use: Yes    Comment: rare glass of wine   Drug use: No   Sexual activity: Yes  Other Topics Concern   Not on file  Social History Narrative   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   Social Drivers of Corporate investment banker Strain: Not on file  Food Insecurity: Not on file  Transportation Needs: Not on file  Physical Activity: Not on file  Stress: Not on file  Social Connections: Not on file   Intimate Partner Violence: Not on file   Family History  Problem Relation Age of Onset   Heart failure Father    Heart attack Father 59   Stroke Father    Allergic rhinitis Mother    Asthma Neg Hx    Eczema Neg Hx    Urticaria Neg Hx     Current Facility-Administered Medications  Medication Dose Route Frequency Provider Last Rate Last Admin   ceFAZolin  (ANCEF ) IVPB 2g/100 mL premix  2 g Intravenous On Call to OR Shari Easter, MD       famotidine  (PEPCID ) 20-0.9 MG/50ML-% IVPB            lactated ringers  infusion   Intravenous Continuous Keneth Lynwood POUR, MD       Facility-Administered Medications Ordered in Other Encounters  Medication Dose Route Frequency Provider Last Rate Last Admin   famotidine  (PEPCID ) IVPB 20 mg premix   Intravenous Anesthesia Intra-op Boyce Shilling, CRNA   20 mg at 11/21/23 9273    Allergies  Allergen Reactions   Erythromycin Nausea And Vomiting   Gentamicin  Other (See Comments)    Had hallucinations during infusion of extended interval once daily gent; resolved with stopping infusion   Sulfa Antibiotics Rash     REVIEW OF SYSTEMS:  [X]  denotes positive finding, [ ]  denotes negative finding Cardiac  Comments:  Chest pain or chest pressure:    Shortness of breath upon exertion:    Short of breath when lying flat:    Irregular heart rhythm:        Vascular    Pain in calf, thigh, or hip brought on by ambulation:    Pain in feet at night that wakes you up from your sleep:     Blood clot in your veins:    Leg swelling:         Pulmonary    Oxygen at home:    Productive cough:     Wheezing:         Neurologic    Sudden weakness in arms or legs:     Sudden numbness in arms or legs:     Sudden onset of difficulty speaking or slurred speech:    Temporary loss of vision in one eye:     Problems with dizziness:         Gastrointestinal    Blood in stool:     Vomited blood:         Genitourinary    Burning when urinating:     Blood in  urine:        Psychiatric    Major depression:         Hematologic    Bleeding problems:  Problems with blood clotting too easily:        Skin    Rashes or ulcers:        Constitutional    Fever or chills:      PHYSICAL EXAMINATION:  Vitals:   11/18/23 1022 11/21/23 0625  BP:  (!) 131/96  Pulse:  88  Resp:  18  Temp:  97.9 F (36.6 C)  TempSrc:  Oral  SpO2:  97%  Weight: 72.6 kg 72.6 kg  Height: 5' 3 (1.6 m) 5' 3 (1.6 m)    General:  WDWN in NAD; vital signs documented above Gait: Not observed HENT: WNL, normocephalic Pulmonary: normal non-labored breathing , without wheezing Cardiac: regular HR Abdomen: soft, NT, no masses Skin: without rashes Vascular Exam/Pulses:  Right Left  Radial 2+ (normal) 2+ (normal)                       Extremities: without ischemic changes, without Gangrene , without cellulitis; without open wounds;  Focal, raised lesion Musculoskeletal: no muscle wasting or atrophy  Neurologic: A&O X 3;  No focal weakness or paresthesias are detected Psychiatric:  The pt has Normal affect.   Non-Invasive Vascular Imaging:   Triphasic waveforms throughout the bypass.  No concern for stenosis    ASSESSMENT/PLAN: Tracy Wade is a 53 y.o. 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  She was discharged from our practice, and was doing well until recently when she noted a lesion in the antecubital fossa with pain to palpation radiating to surrounding tissue.  I discussed her case with Dr. Ahmad, who was planning on removing the lipoma from the posterior aspect of the elbow.   Arterial duplex ultrasound demonstrates widely patent bypass.  I placed an ultrasound probe on her arm as well, which demonstrated a soft tissue mass lateral to the bypass.  I think this could be possibly scar tissue, calcified hematoma or other. I think that it  can be excised safely.    I had a long conversation with Tracy Wade regarding the above.  I think it is reasonable to resect the lesion if it is causing pain.  She is aware that I am unsure as to whether resection will decrease associated pain in the area, and that like all surgeries that comes with risk such as bleeding infection nerve damage amongst others.  After discussing these, Tracy Wade elected to proceed  I will discuss with Dr. Shari and coordinate being available for the operation.     Fonda FORBES Rim, MD [facility] [phone] Total time of patient care including pre-visit research, consultation, and documentation greater than 60 minutes

## 2023-11-21 NOTE — Discharge Instructions (Signed)
 [facility]EEP [facility] CALL OFFICE FOR F/U APPT [id number] in 10 days Rx sent to [facility] KEEP HAND ELEVATED ABOVE HEART OK TO APPLY ICE TO OPERATIVE AREA CONTACT OFFICE IF ANY WORSENING PAIN OR CONCERNS.

## 2023-11-22 ENCOUNTER — Encounter (HOSPITAL_COMMUNITY): Payer: Self-pay | Admitting: Orthopedic Surgery

## 2023-11-22 LAB — SURGICAL PATHOLOGY

## 2023-11-30 NOTE — Progress Notes (Deleted)
 Office Note    HPI: Tracy Tracy is a 53 y.o. (Dec 20, 1970) female presenting status post left mass excision and previous incision of brachial artery and positioned bypass graft.  With Dr. Shari.  I was present due to the proximity of the bypass graft.  Case was uncomplicated, and the patient was discharged without issue.     On exam, Tracy Tracy was Tracy well.  Per notes, Dr. Shari has been very impressed with her rehab over the last several years.  She is back to playing tennis.  She has 2 children, both of which are now in college.  Roughly a year ago, she appreciated a nodule in the left antecubital fossa, at the area of previous incision.  This lesion has continued to grow.  There is pain to palpation, with pain radiating to surrounding tissue.  She denies any new sensory or motor deficit.  Denies pain with use of the left hand.  States that sometimes at night it feels like she is wearing a sleeve on her left arm to the level of the elbow.   Past Medical History:  Diagnosis Date   Angio-edema    Anxiety    Eczema    hx   Fall from horse 09/29/2015   injury to brachial artery   GERD (gastroesophageal reflux disease)    Hypertension    Hypothyroidism    Seasonal allergies     Past Surgical History:  Procedure Laterality Date   ARTERY REPAIR Left 09/29/2015   Procedure: TRANSVERSE BRACHIAL ARTERY REPAIR;  Surgeon: Tracy JULIANNA Doing, MD;  Location: [facility] OR;  Service: Vascular;  Laterality: Left;   CESAREAN SECTION     x 2 and numerous other gynecological procedures   CLOSED REDUCTION ELBOW FRACTURE Left 09/29/2015   Procedure: CLOSED REDUCTION ELBOW;  Surgeon: Tracy Car, MD;  Location: [facility] OR;  Service: Orthopedics;  Laterality: Left;   COLONOSCOPY  2021   ENDOVEIN HARVEST OF GREATER SAPHENOUS VEIN  09/29/2015   Procedure: REVERSE GREATER SAPHENOUS VEIN;  Surgeon: Tracy JULIANNA Doing, MD;  Location: [facility] OR;  Service: Vascular;;   EXCISION, MASS, UPPER EXTREMITY Left 11/21/2023    Procedure: EXCISION OF MASS OF LEFT UPPER EXTREMITY;  Surgeon: Tracy Easter, MD;  Location: [facility] OR;  Service: Orthopedics;  Laterality: Left;   I & D EXTREMITY Left 09/29/2015   Procedure: IRRIGATION AND DEBRIDEMENT,  LEFT ELBOW;  Surgeon: Tracy Car, MD;  Location: [facility] OR;  Service: Orthopedics;  Laterality: Left;   I & D EXTREMITY Left 10/01/2015   Procedure: IRRIGATION AND DEBRIDEMENT LEFT ELBOW;  Surgeon: Tracy Shari, MD;  Location: [facility] OR;  Service: Orthopedics;  Laterality: Left;  left elbow   I & D EXTREMITY Left 10/03/2015   Procedure: IRRIGATION AND DEBRIDEMENT LEFT ELBOW;  Surgeon: Tracy Shari, MD;  Location: [facility] OR;  Service: Orthopedics;  Laterality: Left;   I & D EXTREMITY Left 10/05/2015   Procedure: LEFT ELBOW IRRIGATION AND DEBRIDEMENT, OPEN REDUCTION INTERNAL FIXATION AND REPAIR;  Surgeon: Tracy Shari, MD;  Location: [facility] OR;  Service: Orthopedics;  Laterality: Left;   MYOMECTOMY     fibroid tumor   WISDOM TOOTH EXTRACTION  2023    Social History   Socioeconomic History   Marital status: Married    Spouse name: Not on file   Number of children: Not on file   Years of education: Not on file   Highest education level: Not on file  Occupational History   Not on file  Tobacco Use  Smoking status: Never   Smokeless tobacco: Never  Vaping Use   Vaping status: Never Used  Substance and Sexual Activity   Alcohol use: Yes    Comment: rare glass of wine   Drug use: No   Sexual activity: Yes  Other Topics Concern   Not on file  Social History Narrative   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   Social Drivers of Corporate investment banker Strain: Not on file  Food Insecurity: Not on file  Transportation Needs: Not on file  Physical Activity: Not on file  Stress: Not on file  Social Connections: Not on file  Intimate Partner Violence: Not on file   Family History  Problem Relation Age of Onset    Heart failure Father    Heart attack Father 61   Stroke Father    Allergic rhinitis Mother    Asthma Neg Hx    Eczema Neg Hx    Urticaria Neg Hx     Current Outpatient Medications  Medication Sig Dispense Refill   amLODipine  (NORVASC ) 10 MG tablet Take 10 mg by mouth at bedtime.     Collagen-Vitamin C -Biotin (COLLAGEN PO) Take 1 Dose by mouth daily.     fluticasone  (FLONASE ) 50 MCG/ACT nasal spray Place 1 spray into both nostrils daily as needed for allergies or rhinitis. 16 g 5   losartan (COZAAR) 25 MG tablet TAKE 1 TABLET BY MOUTH EVERY DAY FOR 90 DAYS     Olopatadine  HCl 0.2 % SOLN Apply 1 drop to eye daily as needed (itchy/watery eyes). 2.5 mL 5   Probiotic Product (PROBIOTIC PO) Take 1 Dose by mouth daily.     rosuvastatin (CRESTOR) 5 MG tablet TAKE 1 TABLET BY MOUTH EVERY DAY FOR 90 DAYS     sertraline (ZOLOFT) 50 MG tablet Take 50 mg by mouth at bedtime.     SYNTHROID 100 MCG tablet Take 100 mcg by mouth daily before breakfast.     No current facility-administered medications for this visit.    Allergies  Allergen Reactions   Erythromycin Nausea And Vomiting   Gentamicin  Other (See Comments)    Had hallucinations during infusion of extended interval once daily gent; resolved with stopping infusion   Sulfa Antibiotics Rash     REVIEW OF SYSTEMS:  [X]  denotes positive finding, [ ]  denotes negative finding Cardiac  Comments:  Chest pain or chest pressure:    Shortness of breath upon exertion:    Short of breath when lying flat:    Irregular heart rhythm:        Vascular    Pain in calf, thigh, or hip brought on by ambulation:    Pain in feet at night that wakes you up from your sleep:     Blood clot in your veins:    Leg swelling:         Pulmonary    Oxygen at home:    Productive cough:     Wheezing:         Neurologic    Sudden weakness in arms or legs:     Sudden numbness in arms or legs:     Sudden onset of difficulty speaking or slurred speech:     Temporary loss of vision in one eye:     Problems with dizziness:         Gastrointestinal    Blood in stool:  Vomited blood:         Genitourinary    Burning when urinating:     Blood in urine:        Psychiatric    Major depression:         Hematologic    Bleeding problems:    Problems with blood clotting too easily:        Skin    Rashes or ulcers:        Constitutional    Fever or chills:      PHYSICAL EXAMINATION:  There were no vitals filed for this visit.  General:  WDWN in NAD; vital signs documented above Gait: Not observed HENT: WNL, normocephalic Pulmonary: normal non-labored breathing , without wheezing Cardiac: regular HR Abdomen: soft, NT, no masses Skin: without rashes Vascular Exam/Pulses:  Right Left  Radial 2+ (normal) 2+ (normal)                       Extremities: without ischemic changes, without Gangrene , without cellulitis; without open wounds;  Focal, raised lesion Musculoskeletal: no muscle wasting or atrophy  Neurologic: A&O X 3;  No focal weakness or paresthesias are detected Psychiatric:  The pt has Normal affect.   Non-Invasive Vascular Imaging:   Triphasic waveforms throughout the bypass.  No concern for stenosis    ASSESSMENT/PLAN: Tracy Tracy is a 53 y.o. 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   Most recently she underwent excision of fibrotic tissue at the previous incision site with Dr. Shari.        Fonda FORBES Rim, MD [facility] [phone] Total time of patient care including pre-visit research, consultation, and documentation greater than 60 minutes

## 2023-12-01 ENCOUNTER — Ambulatory Visit: Admitting: Vascular Surgery

## 2023-12-01 ENCOUNTER — Ambulatory Visit (HOSPITAL_COMMUNITY)
